# Patient Record
Sex: Male | Born: 1955 | Race: White | Hispanic: No | Marital: Single | Smoking: Former smoker
Health system: Southern US, Community
[De-identification: ages and names within clinical notes are randomized; demographics above are authoritative.]

## PROBLEM LIST (undated history)

## (undated) DIAGNOSIS — C801 Malignant (primary) neoplasm, unspecified: Secondary | ICD-10-CM

## (undated) DIAGNOSIS — M1990 Unspecified osteoarthritis, unspecified site: Secondary | ICD-10-CM

## (undated) DIAGNOSIS — I10 Essential (primary) hypertension: Secondary | ICD-10-CM

## (undated) DIAGNOSIS — E785 Hyperlipidemia, unspecified: Secondary | ICD-10-CM

## (undated) DIAGNOSIS — E042 Nontoxic multinodular goiter: Secondary | ICD-10-CM

## (undated) DIAGNOSIS — K5790 Diverticulosis of intestine, part unspecified, without perforation or abscess without bleeding: Secondary | ICD-10-CM

## (undated) DIAGNOSIS — F329 Major depressive disorder, single episode, unspecified: Secondary | ICD-10-CM

## (undated) DIAGNOSIS — T7840XA Allergy, unspecified, initial encounter: Secondary | ICD-10-CM

## (undated) DIAGNOSIS — F32A Depression, unspecified: Secondary | ICD-10-CM

## (undated) DIAGNOSIS — K219 Gastro-esophageal reflux disease without esophagitis: Secondary | ICD-10-CM

## (undated) HISTORY — DX: Depression, unspecified: F32.A

## (undated) HISTORY — DX: Malignant (primary) neoplasm, unspecified: C80.1

## (undated) HISTORY — DX: Hyperlipidemia, unspecified: E78.5

## (undated) HISTORY — PX: SALIVARY GLAND SURGERY: SHX768

## (undated) HISTORY — DX: Unspecified osteoarthritis, unspecified site: M19.90

## (undated) HISTORY — DX: Essential (primary) hypertension: I10

## (undated) HISTORY — DX: Nontoxic multinodular goiter: E04.2

## (undated) HISTORY — DX: Diverticulosis of intestine, part unspecified, without perforation or abscess without bleeding: K57.90

## (undated) HISTORY — DX: Major depressive disorder, single episode, unspecified: F32.9

## (undated) HISTORY — DX: Allergy, unspecified, initial encounter: T78.40XA

## (undated) HISTORY — DX: Gastro-esophageal reflux disease without esophagitis: K21.9

---

## 2004-05-21 ENCOUNTER — Ambulatory Visit: Payer: Self-pay | Admitting: Internal Medicine

## 2004-07-07 ENCOUNTER — Ambulatory Visit: Payer: Self-pay | Admitting: Internal Medicine

## 2004-11-14 ENCOUNTER — Ambulatory Visit: Payer: Self-pay | Admitting: Internal Medicine

## 2005-02-04 ENCOUNTER — Ambulatory Visit: Payer: Self-pay | Admitting: Internal Medicine

## 2005-03-03 ENCOUNTER — Ambulatory Visit: Payer: Self-pay | Admitting: Internal Medicine

## 2005-03-06 ENCOUNTER — Ambulatory Visit: Payer: Self-pay | Admitting: Internal Medicine

## 2005-06-30 ENCOUNTER — Ambulatory Visit: Payer: Self-pay | Admitting: Internal Medicine

## 2005-07-07 ENCOUNTER — Ambulatory Visit (HOSPITAL_COMMUNITY): Admission: RE | Admit: 2005-07-07 | Discharge: 2005-07-07 | Payer: Self-pay | Admitting: Internal Medicine

## 2005-07-10 ENCOUNTER — Ambulatory Visit: Payer: Self-pay | Admitting: Endocrinology

## 2005-11-03 ENCOUNTER — Ambulatory Visit: Payer: Self-pay | Admitting: Internal Medicine

## 2006-03-12 ENCOUNTER — Ambulatory Visit: Payer: Self-pay | Admitting: Internal Medicine

## 2006-05-31 ENCOUNTER — Ambulatory Visit: Payer: Self-pay | Admitting: Endocrinology

## 2006-06-25 ENCOUNTER — Ambulatory Visit: Payer: Self-pay | Admitting: Internal Medicine

## 2006-06-25 ENCOUNTER — Ambulatory Visit: Payer: Self-pay | Admitting: Endocrinology

## 2006-06-25 LAB — CONVERTED CEMR LAB
BUN: 9 mg/dL (ref 6–23)
CO2: 32 meq/L (ref 19–32)
Calcium: 9.1 mg/dL (ref 8.4–10.5)
Creatinine, Ser: 1.1 mg/dL (ref 0.4–1.5)
Glomerular Filtration Rate, Af Am: 90 mL/min/{1.73_m2}
Glucose, Bld: 107 mg/dL — ABNORMAL HIGH (ref 70–99)
Hgb A1c MFr Bld: 5.2 % (ref 4.6–6.0)
Potassium: 4.4 meq/L (ref 3.5–5.1)
TSH: 0.65 microintl units/mL (ref 0.35–5.50)

## 2006-07-05 ENCOUNTER — Encounter: Admission: RE | Admit: 2006-07-05 | Discharge: 2006-07-05 | Payer: Self-pay | Admitting: Nurse Practitioner

## 2007-01-04 ENCOUNTER — Ambulatory Visit: Payer: Self-pay | Admitting: Internal Medicine

## 2007-01-24 DIAGNOSIS — J309 Allergic rhinitis, unspecified: Secondary | ICD-10-CM

## 2007-03-16 IMAGING — US US SOFT TISSUE HEAD/NECK
1 series · 14 of 25 positions shown · non-contrast
Comparison: none

CLINICAL DATA: Thyroid nodule

[Series 1: unknown · 0.12mm/px · 14 of 39 slices shown]
[im 1/39]
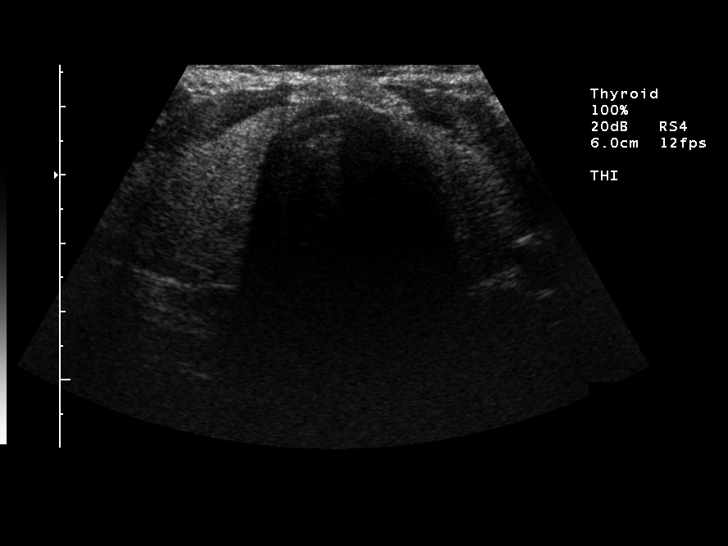
[im 4/39]
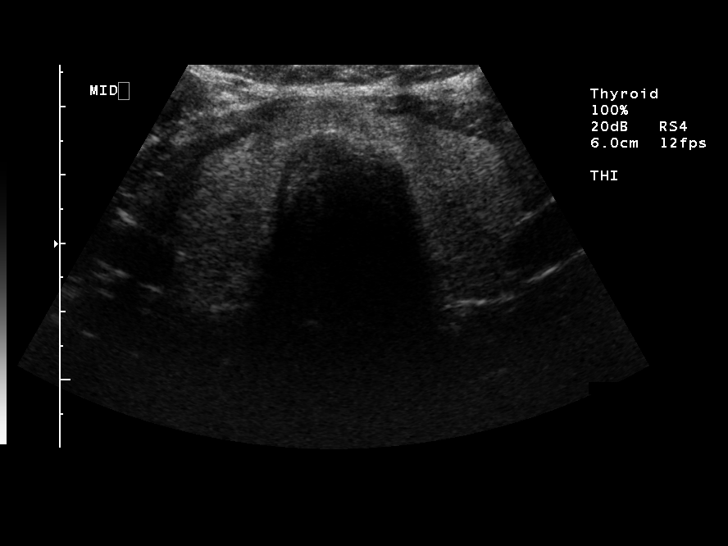
[im 7/39]
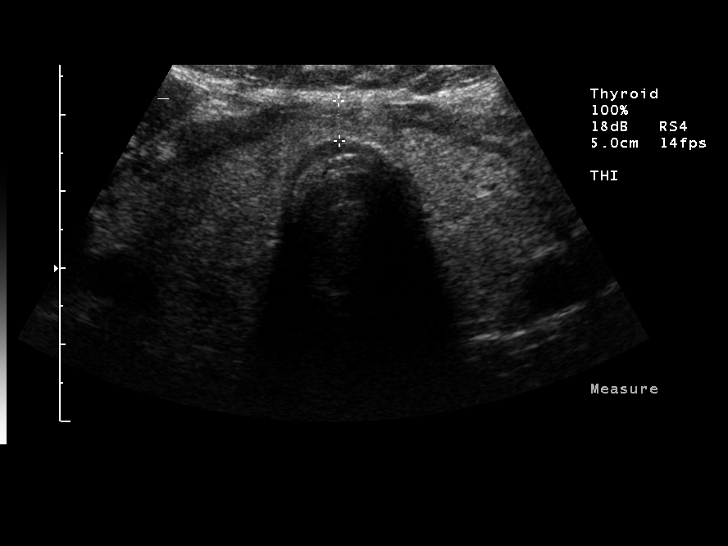
[im 10/39]
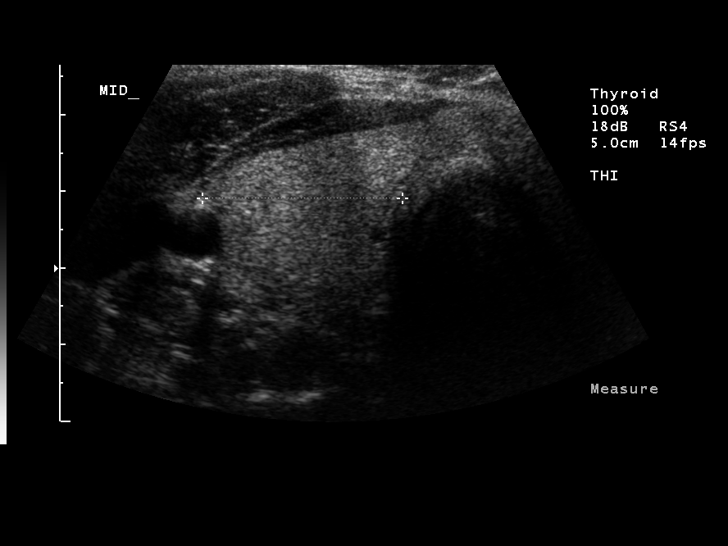
[im 13/39]
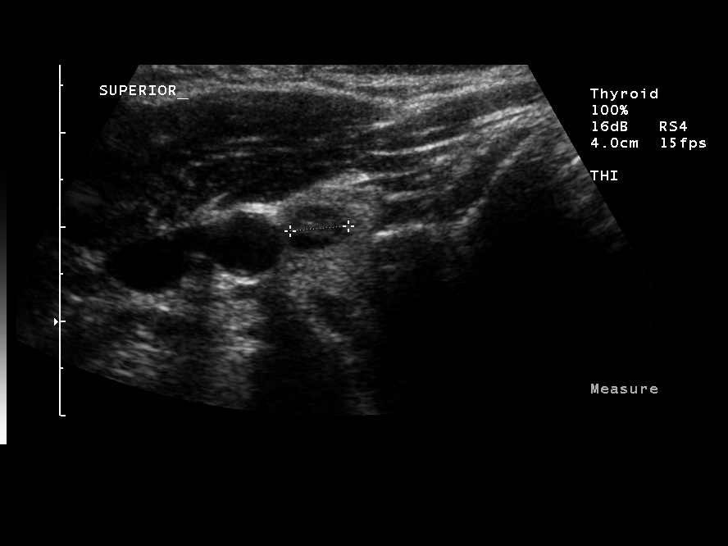
[im 15/39]
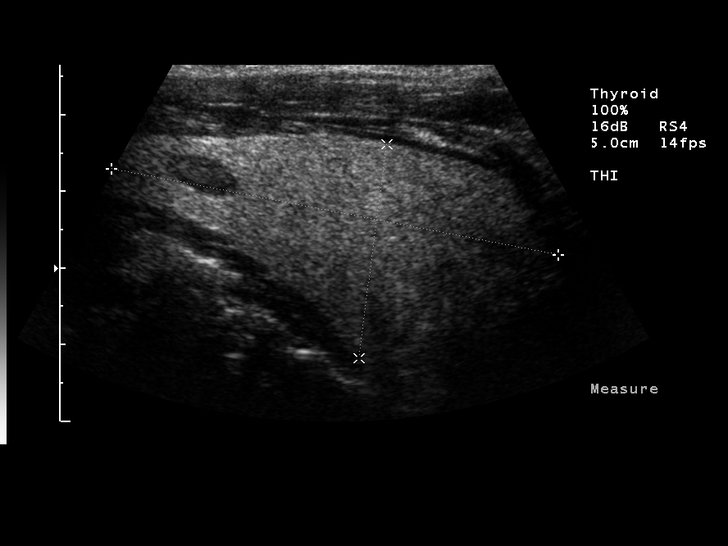
[im 18/39]
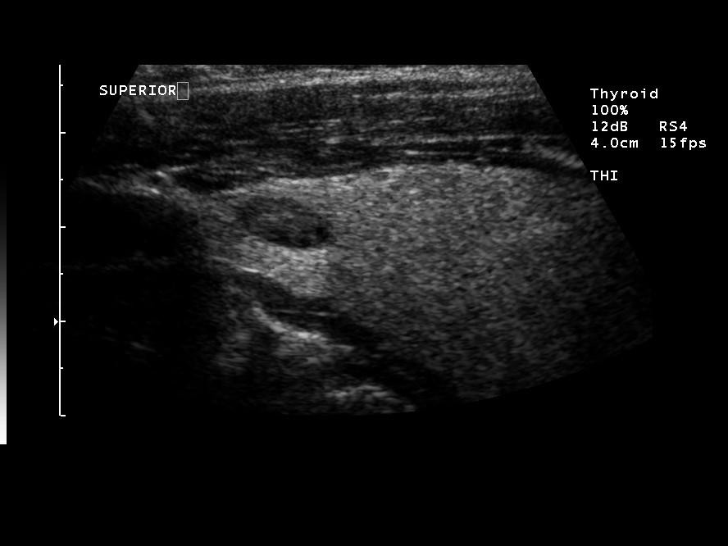
[im 21/39]
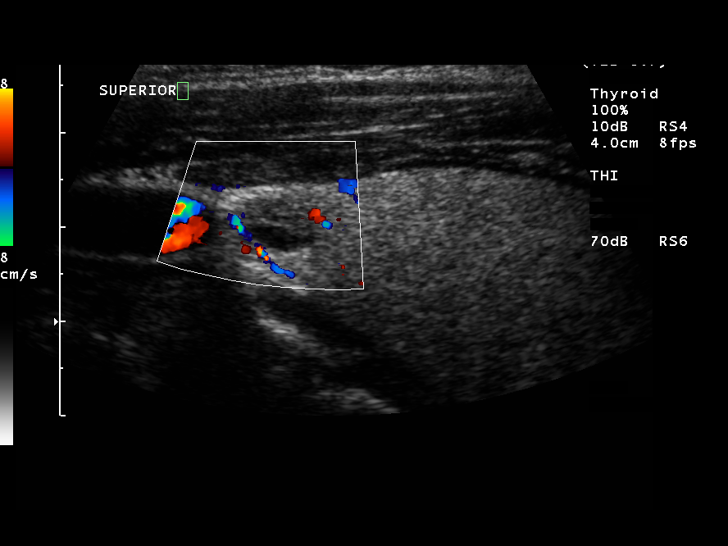
[im 24/39]
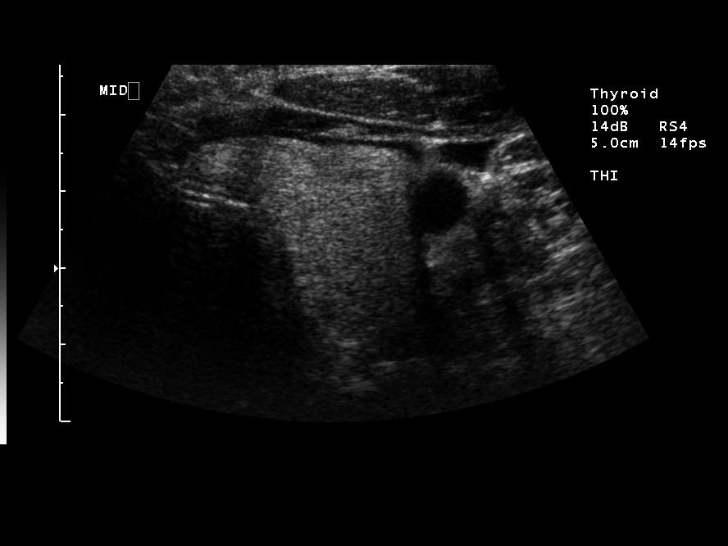
[im 26/39]
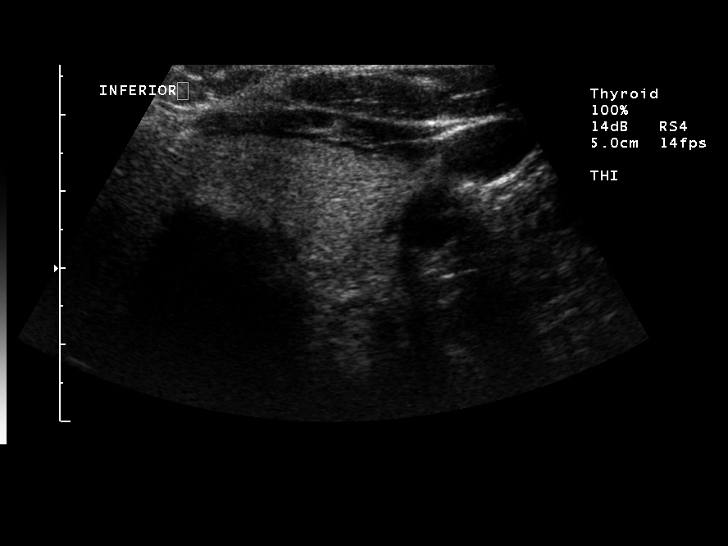
[im 29/39]
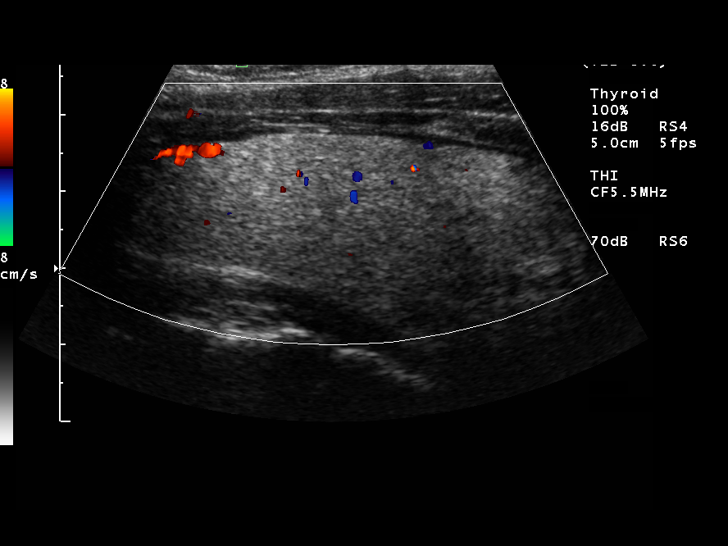
[im 32/39]
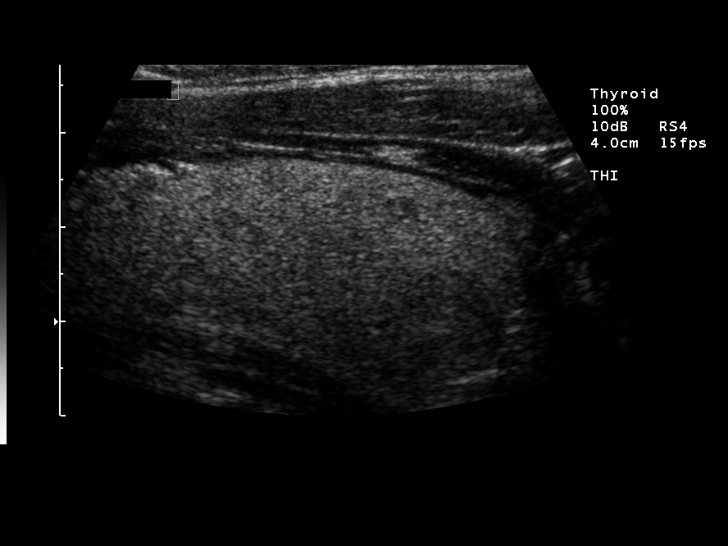
[im 35/39]
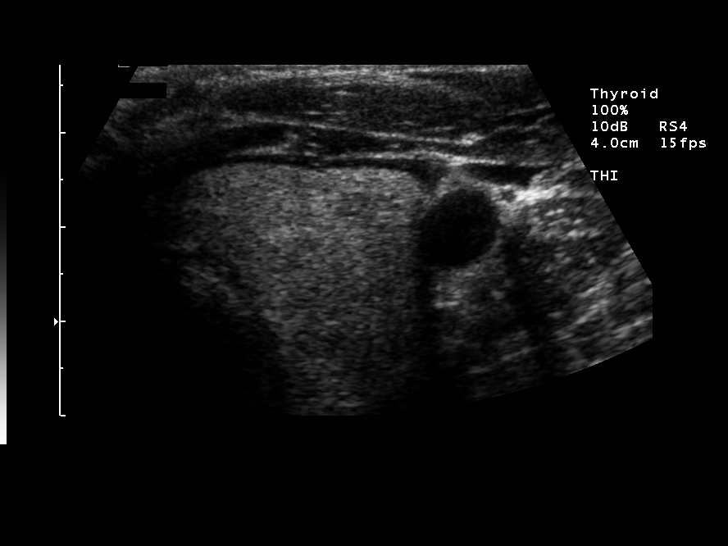
[im 39/39]
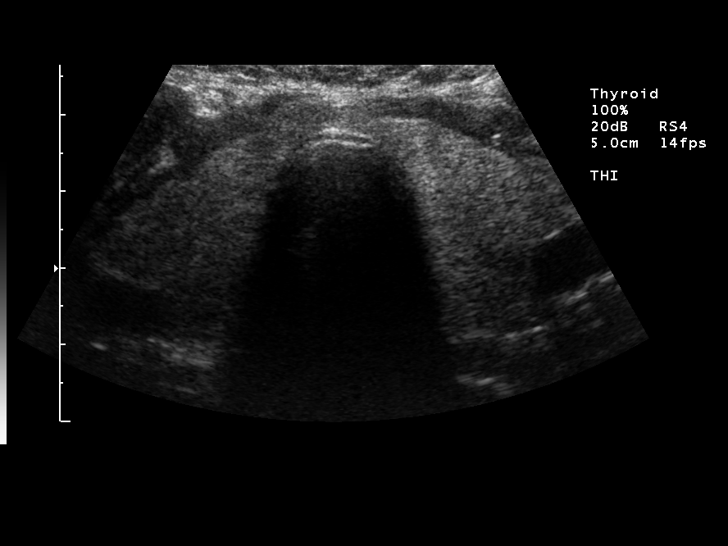

[14 of 25 positions shown; findings below may reference images not displayed]

Ultrasound thyroid:

No previous available for comparison. The right lobe measures 26 x 20 x 59 mm.
There is a 5 x 6 x 11 mm hypoechoic nodule in the superior pole. Isthmus 5 mm in
thickness, unremarkable. Left lobe measures 21 x 24 x 62 mm, containing a 2 x 3
x 3 mm nearly isoechoic nodule in its inferior pole. Remainder of background
thyroid parenchyma is relatively homogeneous in echotexture.
IMPRESSION: 1. A single 11 mm right thyroid nodule. Recommend continued surveillance or FNA.
2. Tiny 3 mm left thyroid nodule

## 2007-06-03 ENCOUNTER — Ambulatory Visit: Payer: Self-pay | Admitting: Internal Medicine

## 2007-06-03 DIAGNOSIS — E739 Lactose intolerance, unspecified: Secondary | ICD-10-CM

## 2007-06-03 DIAGNOSIS — F329 Major depressive disorder, single episode, unspecified: Secondary | ICD-10-CM

## 2007-06-03 DIAGNOSIS — E785 Hyperlipidemia, unspecified: Secondary | ICD-10-CM

## 2007-06-03 DIAGNOSIS — K219 Gastro-esophageal reflux disease without esophagitis: Secondary | ICD-10-CM | POA: Insufficient documentation

## 2007-06-03 DIAGNOSIS — F32A Depression, unspecified: Secondary | ICD-10-CM | POA: Insufficient documentation

## 2008-01-06 ENCOUNTER — Ambulatory Visit: Payer: Self-pay | Admitting: Internal Medicine

## 2008-01-06 DIAGNOSIS — M545 Low back pain: Secondary | ICD-10-CM

## 2008-01-06 DIAGNOSIS — J019 Acute sinusitis, unspecified: Secondary | ICD-10-CM | POA: Insufficient documentation

## 2008-03-13 IMAGING — US US SOFT TISSUE HEAD/NECK
1 series · 13 of 25 positions shown · non-contrast
Comparison: 07/07/05.

CLINICAL DATA: Follow-up goiter with nodules.  
THYROID ULTRASOUND:
TECHNIQUE: Ultrasound examination of the thyroid gland and adjacent soft tissue structures was performed.

[Series 1: us soft tissue head/neck · 0.09mm/px · 13 of 60 slices shown]
[im 1/60]
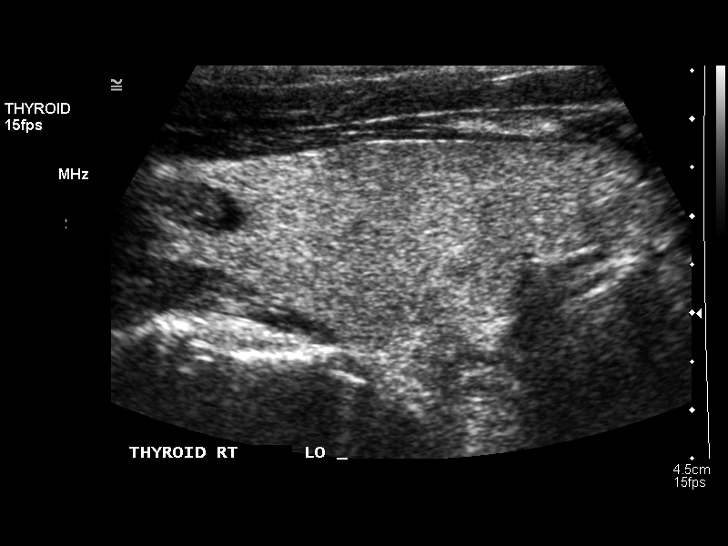
[im 5/60]
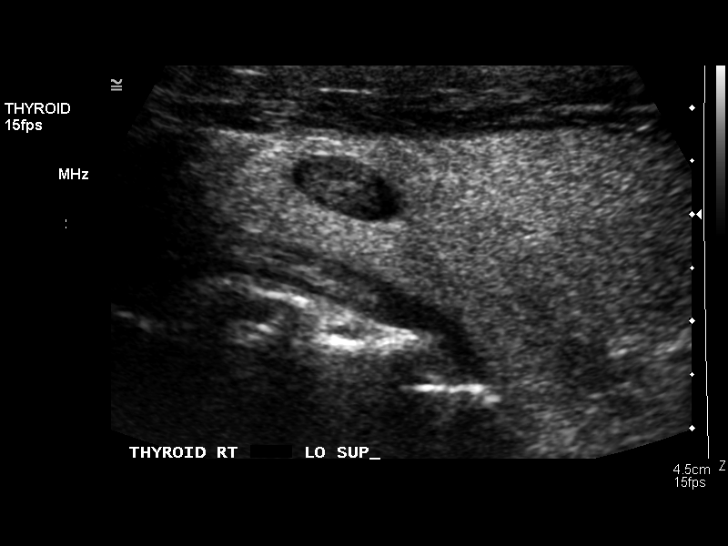
[im 10/60]
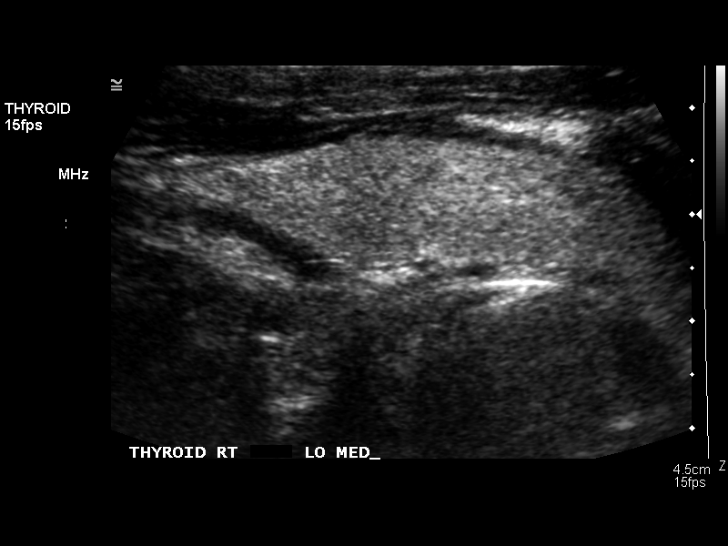
[im 15/60]
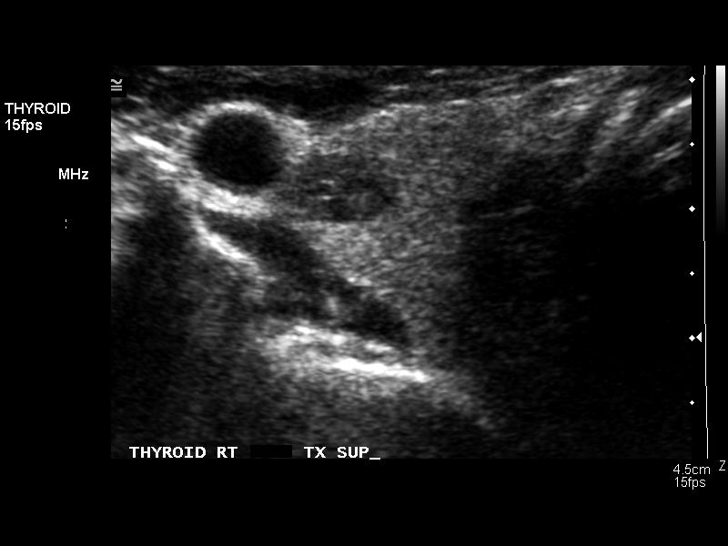
[im 20/60]
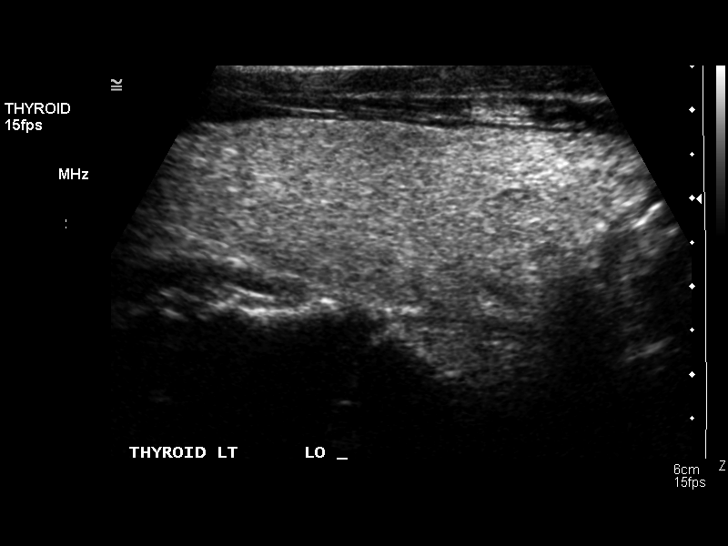
[im 25/60]
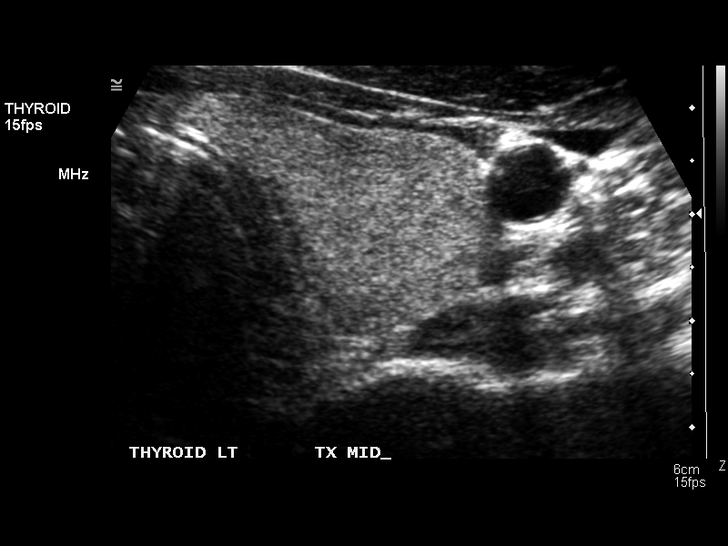
[im 30/60]
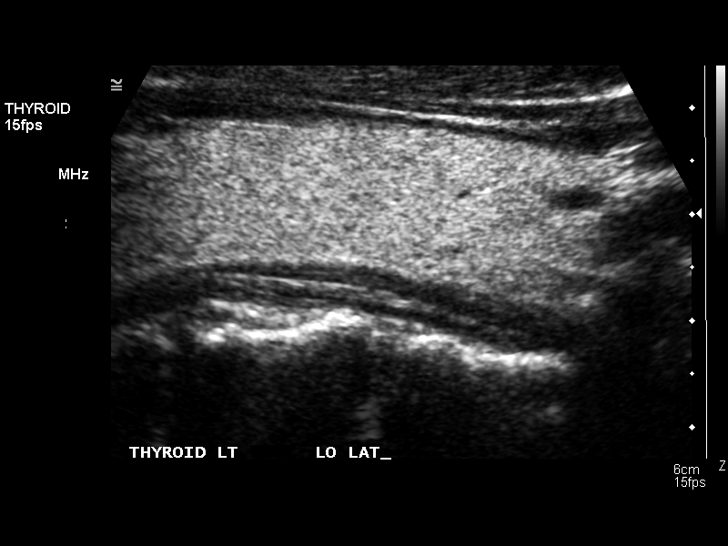
[im 35/60]
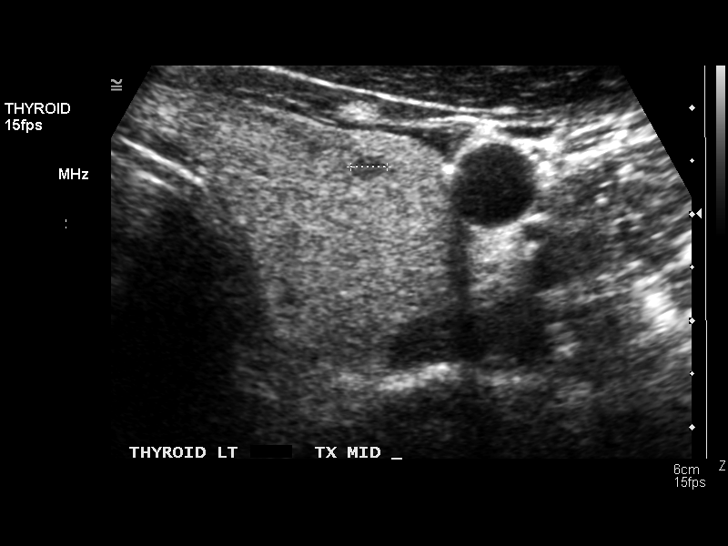
[im 40/60]
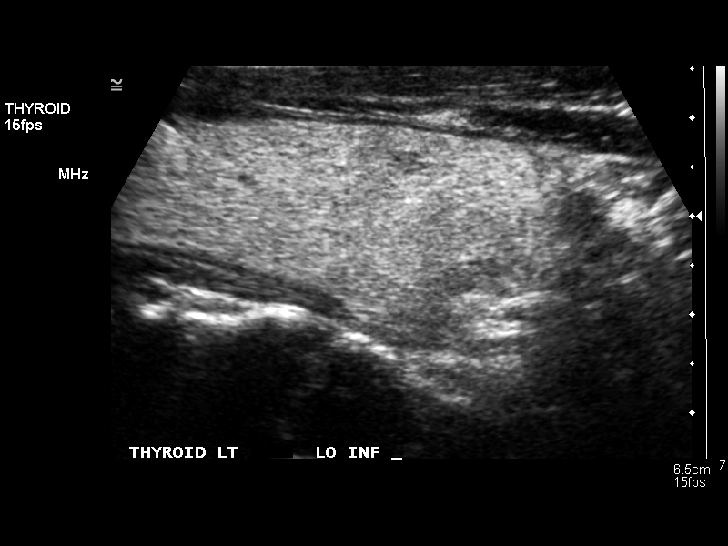
[im 45/60]
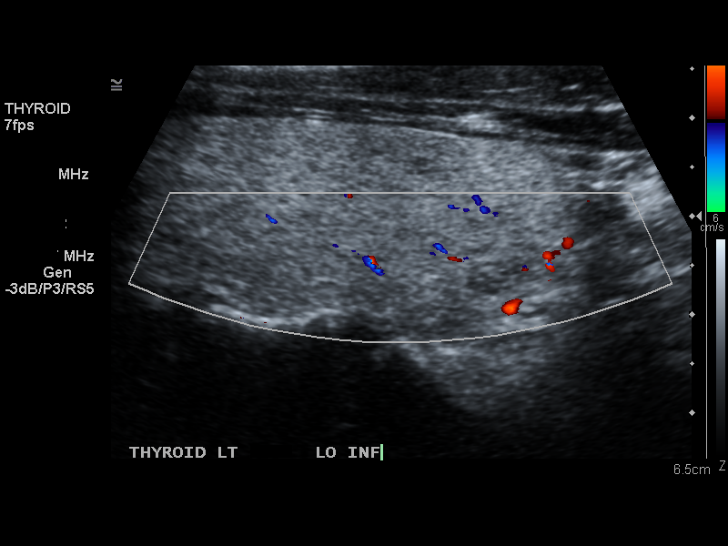
[im 50/60]
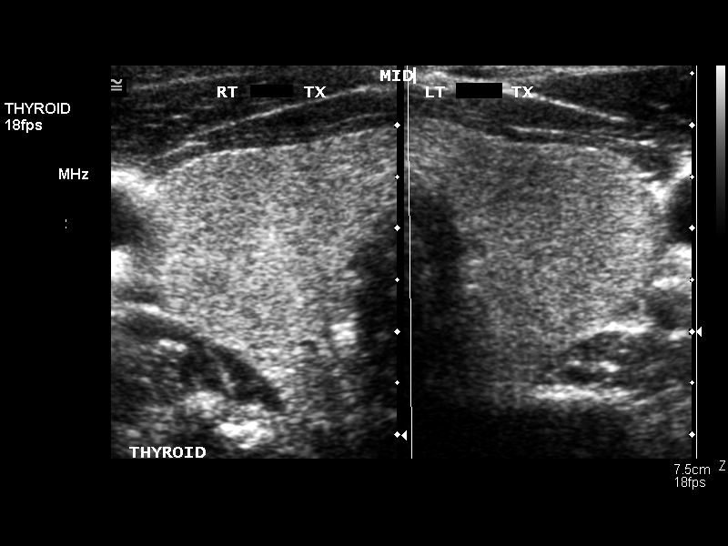
[im 55/60]
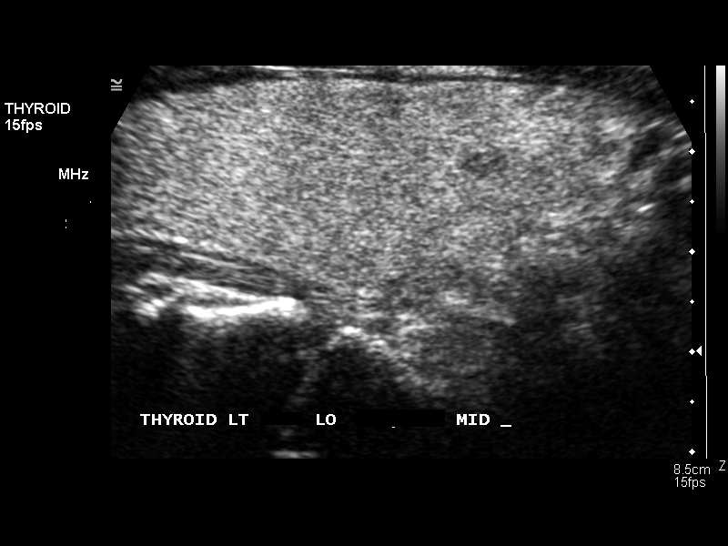
[im 60/60]
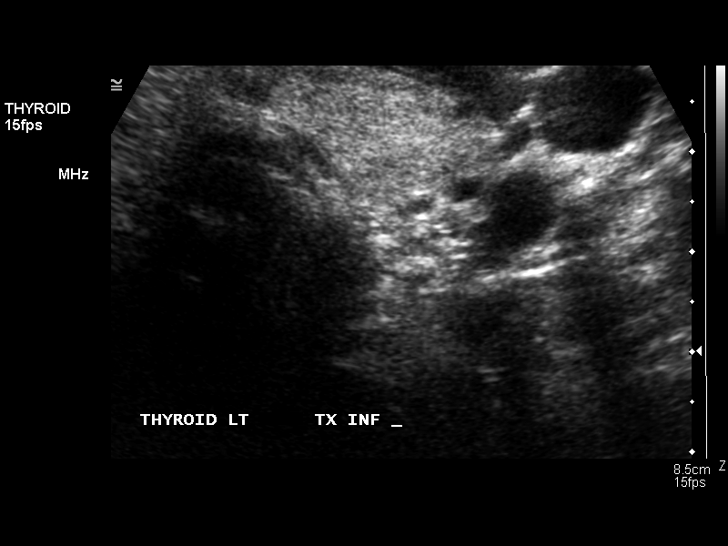

[13 of 25 positions shown; findings below may reference images not displayed]

FINDINGS: The right lobe currently measures 5.6 x 2.2 x 2.2 cm.  The left is currently 6.2 x 2.5 x 2.1 cm.  The size of the gland has not significantly changed.  As noted previously, there is an ovoid shaped nodule in the superior aspect of the right lobe.  It measures 11 mm in long axis by 6  by 8 mm.    This is not significantly changed.  On today's exam, there are several small lesions in the middle and lower aspects of the left lobe.  These are all small ovoid lesions that have a similar appearance to the lesion in the right lobe.  The largest is 7 mm in the posterior aspect of the inferior left lobe.  I cannot say that this lesion was present previously.  A 5 mm nodule in the mid left lobe appears to have increased from about 4 to about 5 mm.  A third small lesion on the left measuring 4 mm may be a new finding.
The most likely diagnosis is multinodular goiter.  None of the nodules is dominant.  I would recommend continued surveillance since there appear to be two new nodules on the left.
IMPRESSION: 1.  Stable gland enlargement.
2.  Stable lesion in the superior aspect of the right lobe.
3.  There now appear to be three small solid lesions in the left lobe, two of which appear new.  
4.  Recommend one year follow-up in light of the new findings on the left.

## 2008-06-20 ENCOUNTER — Ambulatory Visit: Payer: Self-pay | Admitting: Internal Medicine

## 2008-09-18 ENCOUNTER — Ambulatory Visit: Payer: Self-pay | Admitting: Internal Medicine

## 2008-09-18 DIAGNOSIS — R42 Dizziness and giddiness: Secondary | ICD-10-CM

## 2008-10-22 ENCOUNTER — Telehealth: Payer: Self-pay | Admitting: Internal Medicine

## 2008-10-24 ENCOUNTER — Emergency Department (HOSPITAL_COMMUNITY): Admission: EM | Admit: 2008-10-24 | Discharge: 2008-10-24 | Payer: Self-pay | Admitting: Emergency Medicine

## 2008-11-16 ENCOUNTER — Ambulatory Visit: Payer: Self-pay | Admitting: Internal Medicine

## 2008-11-16 LAB — CONVERTED CEMR LAB
Basophils Absolute: 0 10*3/uL (ref 0.0–0.1)
Bilirubin Urine: NEGATIVE
Calcium: 9.3 mg/dL (ref 8.4–10.5)
Creatinine, Ser: 1 mg/dL (ref 0.4–1.5)
GFR calc non Af Amer: 82.46 mL/min (ref >60)
Glucose, Bld: 97 mg/dL (ref 70–99)
HCT: 46.8 % (ref 39.0–52.0)
HDL: 32.3 mg/dL — ABNORMAL LOW (ref >39.00)
Hemoglobin, Urine: NEGATIVE
Hemoglobin: 16.1 g/dL (ref 13.0–17.0)
Lymphs Abs: 2.2 10*3/uL (ref 0.7–4.0)
MCHC: 34.5 g/dL (ref 30.0–36.0)
Monocytes Absolute: 0.4 10*3/uL (ref 0.1–1.0)
Neutrophils Relative %: 56.7 % (ref 43.0–77.0)
Nitrite: NEGATIVE
PSA: 1.03 ng/mL (ref 0.10–4.00)
Potassium: 3.8 meq/L (ref 3.5–5.1)
RBC: 5.1 M/uL (ref 4.22–5.81)
RDW: 12.1 % (ref 11.5–14.6)
Sodium: 144 meq/L (ref 135–145)
Specific Gravity, Urine: 1.025 (ref 1.000–1.030)
Total Bilirubin: 1.1 mg/dL (ref 0.3–1.2)
Urine Glucose: NEGATIVE mg/dL
Urobilinogen, UA: 0.2 (ref 0.0–1.0)
pH: 5.5 (ref 5.0–8.0)

## 2008-11-23 ENCOUNTER — Ambulatory Visit: Payer: Self-pay | Admitting: Internal Medicine

## 2008-11-23 DIAGNOSIS — F419 Anxiety disorder, unspecified: Secondary | ICD-10-CM | POA: Insufficient documentation

## 2008-11-23 DIAGNOSIS — F411 Generalized anxiety disorder: Secondary | ICD-10-CM

## 2009-10-01 ENCOUNTER — Ambulatory Visit: Payer: Self-pay | Admitting: Internal Medicine

## 2009-10-01 DIAGNOSIS — R635 Abnormal weight gain: Secondary | ICD-10-CM

## 2009-10-01 DIAGNOSIS — K602 Anal fissure, unspecified: Secondary | ICD-10-CM | POA: Insufficient documentation

## 2009-10-01 DIAGNOSIS — K921 Melena: Secondary | ICD-10-CM

## 2009-10-03 ENCOUNTER — Telehealth: Payer: Self-pay | Admitting: Internal Medicine

## 2009-11-18 ENCOUNTER — Telehealth: Payer: Self-pay | Admitting: Internal Medicine

## 2009-11-18 ENCOUNTER — Ambulatory Visit: Payer: Self-pay | Admitting: Internal Medicine

## 2010-02-19 ENCOUNTER — Telehealth: Payer: Self-pay | Admitting: Internal Medicine

## 2010-05-15 ENCOUNTER — Ambulatory Visit: Payer: Self-pay | Admitting: Internal Medicine

## 2010-05-15 DIAGNOSIS — J069 Acute upper respiratory infection, unspecified: Secondary | ICD-10-CM | POA: Insufficient documentation

## 2010-07-03 IMAGING — CR DG CHEST 2V
2 series · 2 of 2 positions shown · non-contrast
Comparison: None

CLINICAL DATA: Weakness.

CHEST - 2 VIEW

[w chest pa]
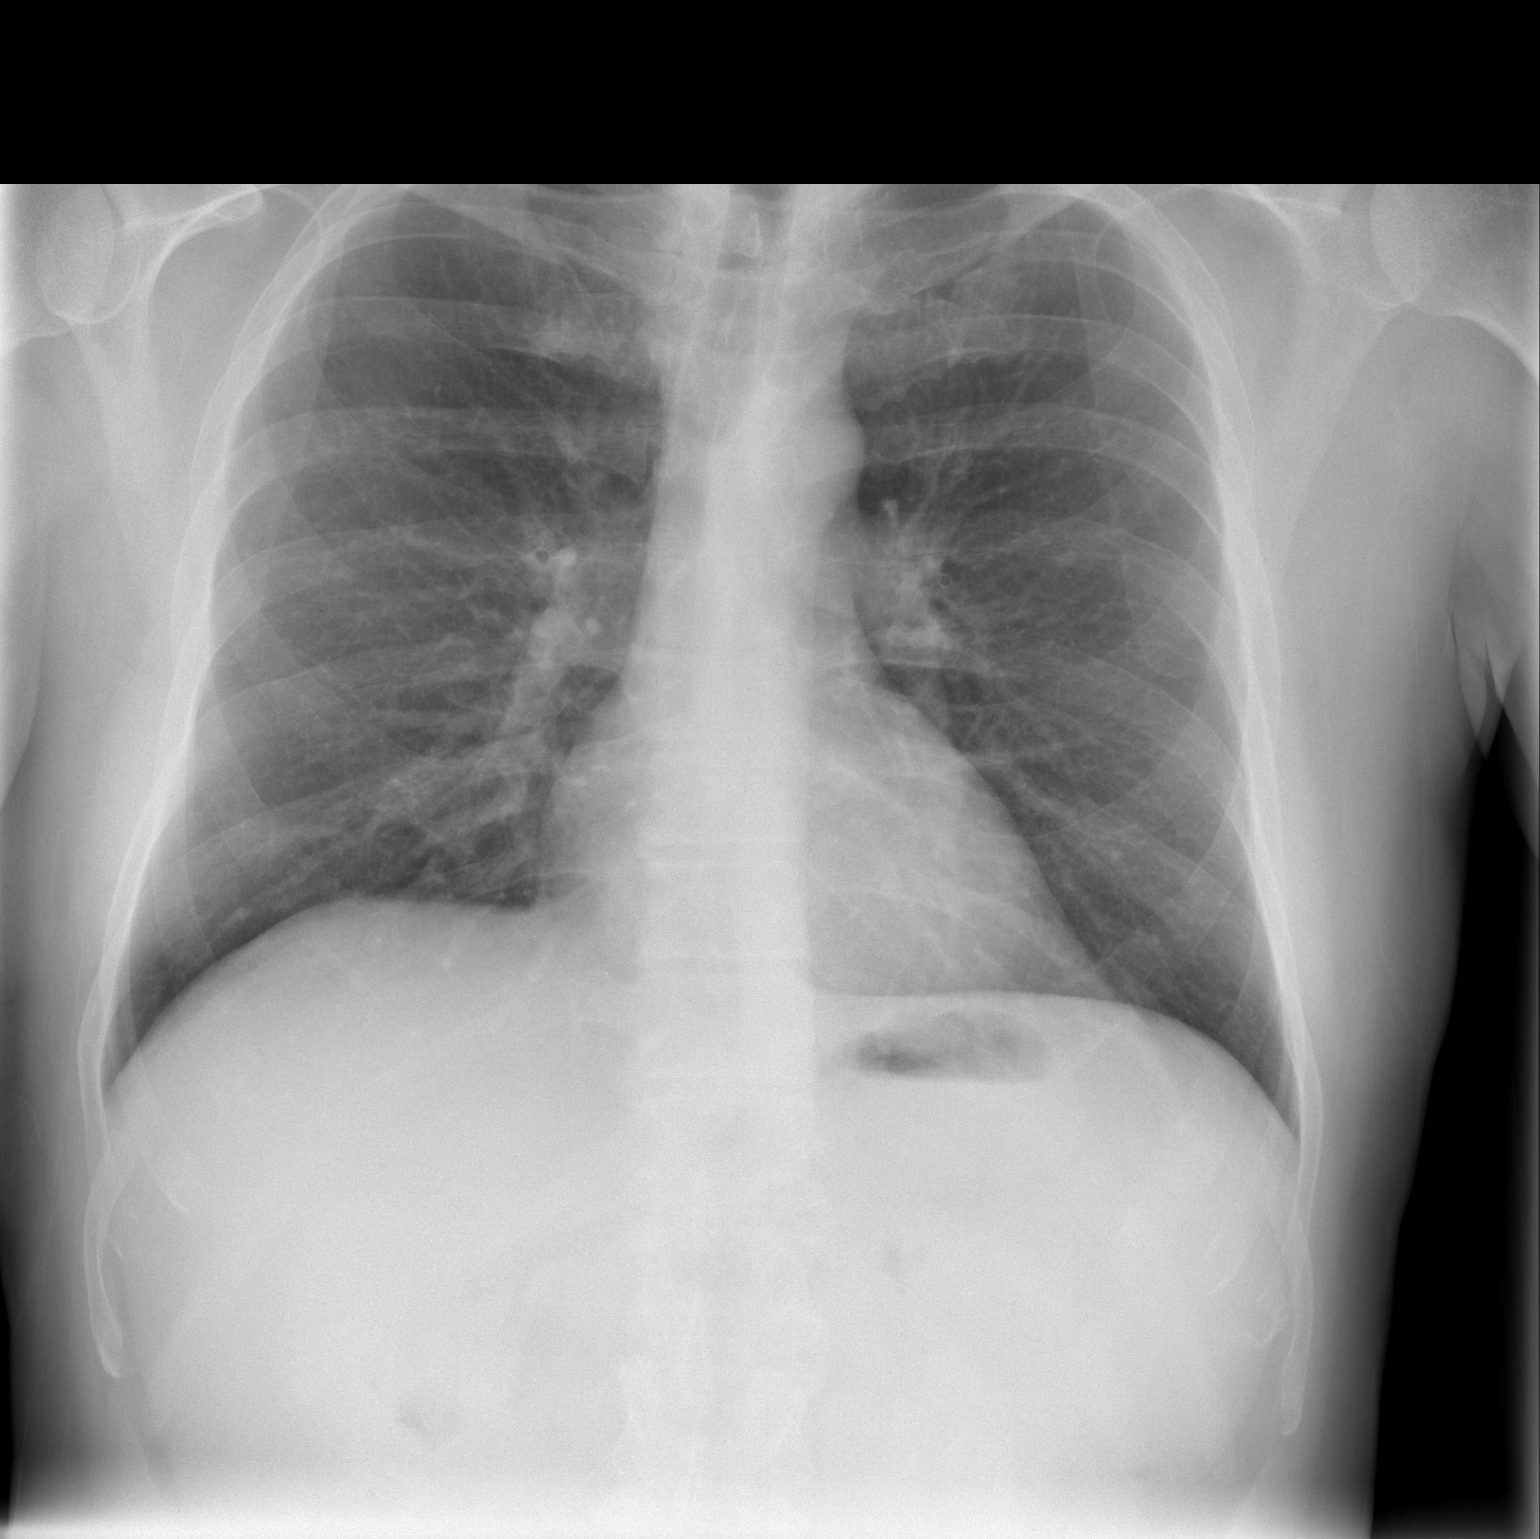

[w chest lat]
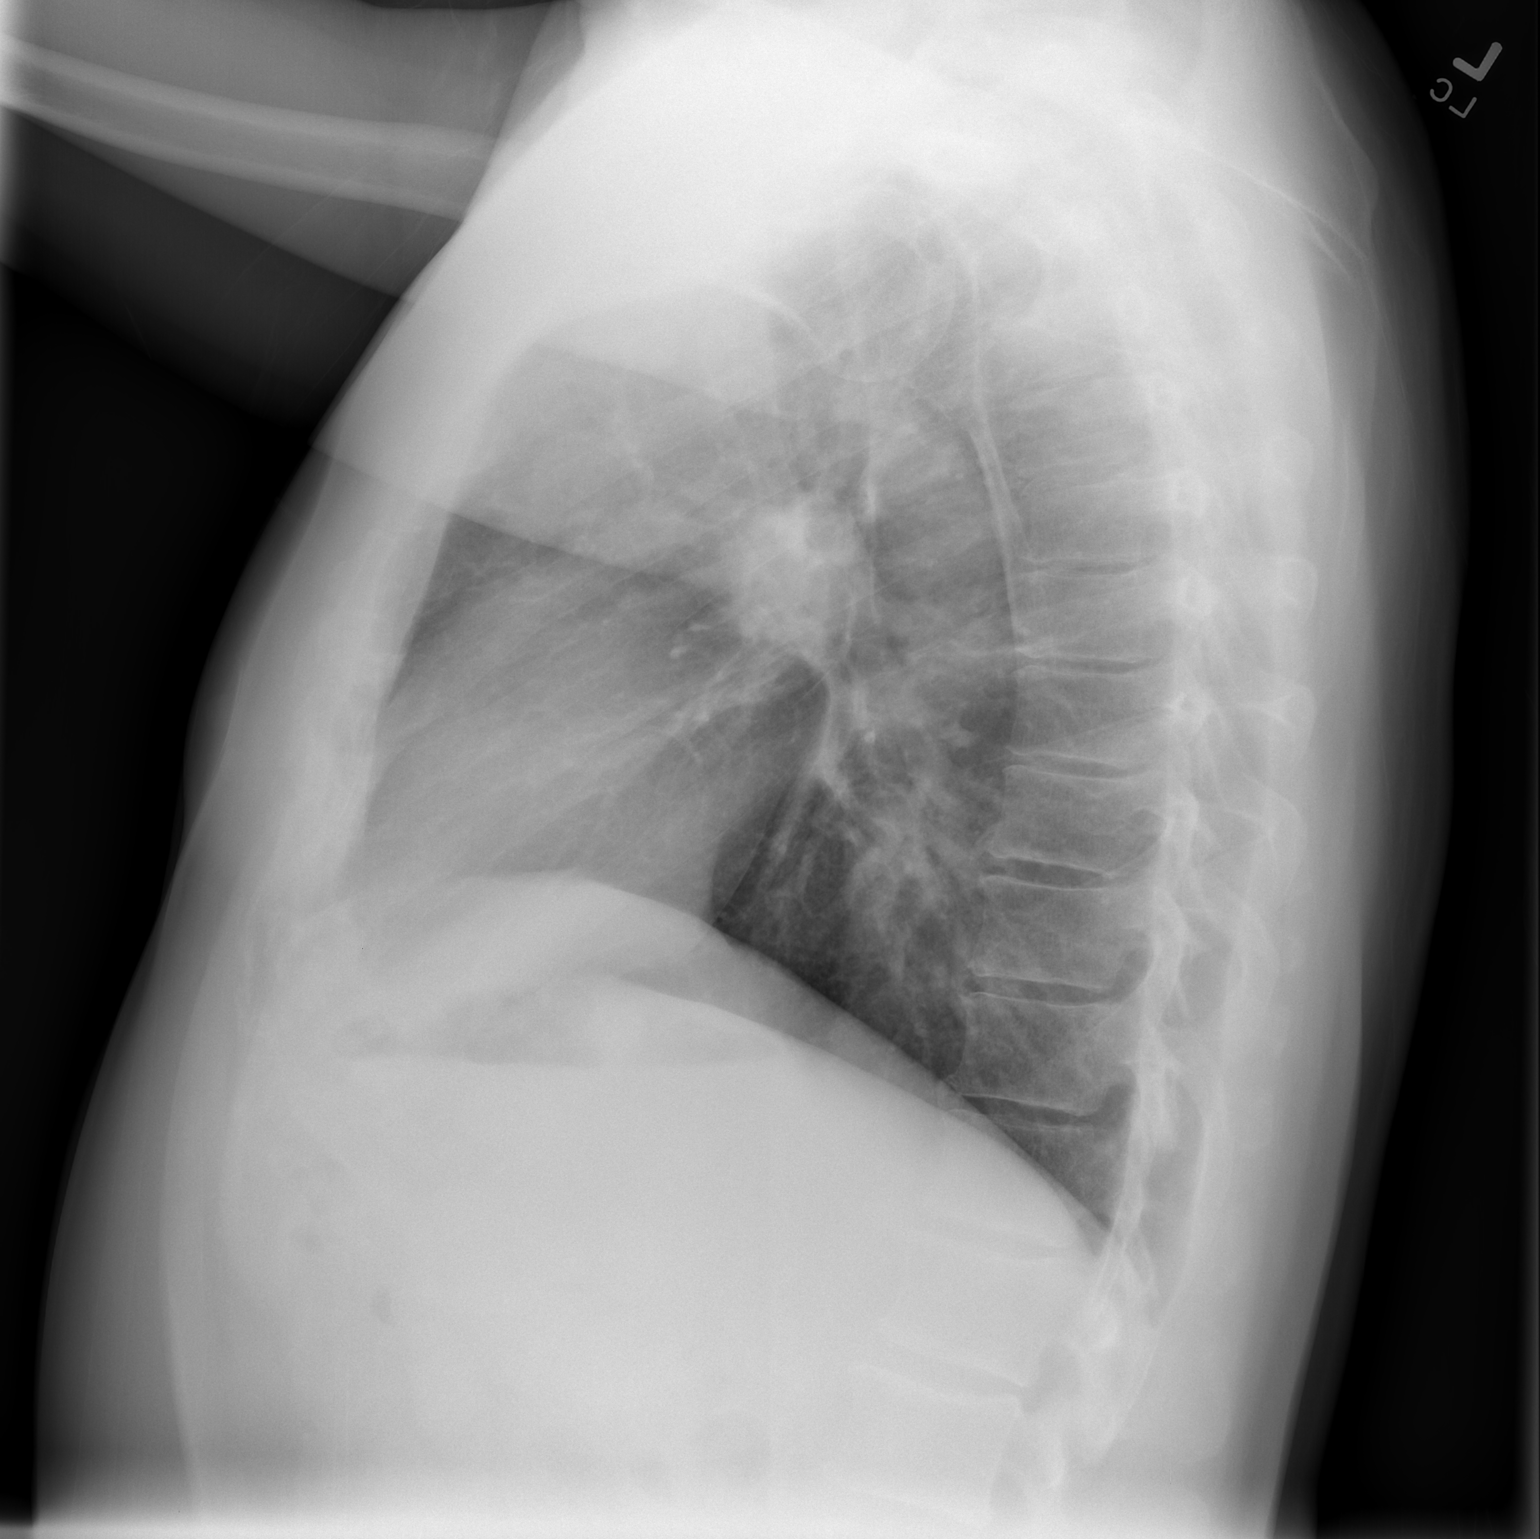

[2 of 2 positions shown; findings below may reference images not displayed]

FINDINGS: Heart and mediastinal contours are within normal limits.
No focal opacities or effusions.  No acute bony abnormality.
IMPRESSION: No active disease.

## 2010-07-04 ENCOUNTER — Ambulatory Visit: Admit: 2010-07-04 | Payer: Self-pay | Admitting: Internal Medicine

## 2010-07-11 ENCOUNTER — Ambulatory Visit: Admit: 2010-07-11 | Payer: Self-pay | Admitting: Internal Medicine

## 2010-07-11 ENCOUNTER — Ambulatory Visit
Admission: RE | Admit: 2010-07-11 | Discharge: 2010-07-11 | Payer: Self-pay | Source: Home / Self Care | Attending: Internal Medicine | Admitting: Internal Medicine

## 2010-07-20 ENCOUNTER — Encounter: Payer: Self-pay | Admitting: Internal Medicine

## 2010-07-27 LAB — CONVERTED CEMR LAB: Blood Glucose, Fingerstick: 145

## 2010-07-31 NOTE — Assessment & Plan Note (Signed)
Summary: TDAP / SD  Nurse Visit   Allergies: 1)  ! * Avelox  Immunizations Administered:  Tetanus Vaccine:    Vaccine Type: Tdap    Site: left deltoid    Mfr: GlaxoSmithKline    Dose: 0.5 ml    Route: IM    Given by: Ami Bullins CMA    Exp. Date: 09/21/2011    Lot #: [id number]    VIS given: 05/17/07 version given Nov 18, 2009.  Orders Added: 1)  Tdap => 27yrs IM [90715] 2)  Admin 1st Vaccine [09811]

## 2010-07-31 NOTE — Progress Notes (Signed)
  Phone Note Call from Patient   Summary of Call: Pt cut his finger on lawnmower, needs tetanus. Scheduled for today Initial call taken by: Lamar Sprinkles, CMA,  Nov 18, 2009 8:43 AM

## 2010-07-31 NOTE — Assessment & Plan Note (Signed)
Summary: SORE THROAT/ SINUS/NWS  #   Vital Signs:  Patient profile:   55 year old male Height:      70 inches Weight:      211 pounds BMI:     30.38 Temp:     99.1 degrees F oral Pulse rate:   80 / minute Pulse rhythm:   regular Resp:     16 per minute BP sitting:   126 / 90  (left arm) Cuff size:   regular  Vitals Entered By: Lanier Prude, Beverly Gust) (May 15, 2010 10:56 AM) CC: sinus congestion/drainage and sore throat X 2-3 days Is Patient Diabetic? No Comments pt is not currently taking any meds.    CC:  sinus congestion/drainage and sore throat X 2-3 days.  History of Present Illness: The patient presents with complaints of sore throat, fever, cough, sinus congestion and drainge of 2 days duration. Not better with OTC meds. Muscle aches are present.  The mucus is colored.   Current Medications (verified): 1)  Vitamin D 1000 Unit Tabs (Cholecalciferol) .Marland Kitchen.. 1 By Mouth Qd 2)  Dexilant 60 Mg Cpdr (Dexlansoprazole) .Marland Kitchen.. 1 By Mouth Q Am For Indigestion  Allergies (verified): 1)  ! 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   Neck:  L submand salivary gland is enlarged and L anter LN below the saliv gland - NT Lungs:  Normal respiratory effort, chest expands symmetrically. Lungs are clear to auscultation, no crackles or wheezes. Heart:  Normal rate and regular rhythm. S1 and S2 normal without gallop, murmur, click, rub or other extra sounds.   Impression & Recommendations:  Problem # 1:  ALLERGIC RHINITIS (ICD-477.9) Assessment  New  His updated medication list for this problem includes:    Loratadine 10 Mg Tabs (Loratadine) .Marland Kitchen... 1 by mouth once daily as needed allergies  Problem # 2:  UPPER RESPIRATORY INFECTION, ACUTE (ICD-465.9) Assessment: New Zpac if worse His updated medication list for this problem includes:    Loratadine 10 Mg Tabs (Loratadine) .Marland Kitchen... 1 by mouth once daily as needed allergies  Complete Medication List: 1)  Vitamin D 1000 Unit Tabs (Cholecalciferol) .Marland Kitchen.. 1 by mouth qd 2)  Dexilant 60 Mg Cpdr (Dexlansoprazole) .Marland Kitchen.. 1 by mouth q am for indigestion 3)  Loratadine 10 Mg Tabs (Loratadine) .Marland Kitchen.. 1 by mouth once daily as needed allergies 4)  Zithromax Z-pak 250 Mg Tabs (Azithromycin) .... As dirrected  Patient Instructions: 1)  Please schedule a follow-up appointment in 2-3 months well w/labs and with Allergy profile Region II 477.90 . Prescriptions: LORATADINE 10 MG TABS (LORATADINE) 1 by mouth once daily as needed allergies  #30 x 6   Entered and Authorized by:   Tresa Garter MD   Signed by:   Tresa Garter MD on 05/15/2010   Method used:   Print then Give to Patient   RxID:   [id number] ZITHROMAX Z-PAK 250 MG TABS (AZITHROMYCIN) as dirrected  #1 x 0   Entered and Authorized by:   Tresa Garter MD   Signed by:   Tresa Garter MD on 05/15/2010   Method used:   Print then Give to  Patient   RxID:   [id number]    Orders Added: 1)  Est. Patient Level III [14782]

## 2010-07-31 NOTE — Progress Notes (Signed)
Summary: ABX  Phone Note Call from Patient Call back at [phone]   Summary of Call: patient left message on triage in regards to his sore throat asking if he should have rec'd abx. Please advise. Initial call taken by: Lucious Groves,  October 03, 2009 10:17 AM  Follow-up for Phone Call        OK Z pac Follow-up by: Tresa Garter MD,  October 03, 2009 12:11 PM  Additional Follow-up for Phone Call Additional follow up Details #1::        patient notified. Additional Follow-up by: Lucious Groves,  October 03, 2009 1:32 PM    New/Updated Medications: ZITHROMAX Z-PAK 250 MG TABS (AZITHROMYCIN) as dirrected Prescriptions: ZITHROMAX Z-PAK 250 MG TABS (AZITHROMYCIN) as dirrected  #1 x 0   Entered by:   Lucious Groves   Authorized by:   Tresa Garter MD   Signed by:   Lucious Groves on 10/03/2009   Method used:   [facility] to        [facility] [address] [phone]* (retail)       [address]       [place], [place]  [address]       Ph: [phone] or [phone]       Fax: [phone]   RxID:   [id number]

## 2010-07-31 NOTE — Assessment & Plan Note (Signed)
Summary: STOMACH PROBLEM  STC   Vital Signs:  Patient profile:   55 year old male Height:      70 inches Weight:      209 pounds BMI:     30.10 O2 Sat:      97 % on Room air Temp:     98.6 degrees F oral Pulse rate:   93 / minute BP sitting:   150 / 90  (left arm) Cuff size:   regular  Vitals Entered By: Lucious Groves (October 01, 2009 11:02 AM)  O2 Flow:  Room air CC: Pt did a detox for 3 weeks and feels it caused too much acid. ?reflux? Pt epigastric discomfort is relieved by food. Patient also c/o nasal drainage at night./kb Is Patient Diabetic? No Pain Assessment Patient in pain? no      Comments Patient states that he is not taking any of the meds previously on his med list./kb   CC:  Pt did a detox for 3 weeks and feels it caused too much acid. ?reflux? Pt epigastric discomfort is relieved by food. Patient also c/o nasal drainage at night./kb.  History of Present Illness: C/o abd discomfort after he  started to drink lemon juce of 1 lemon w/water in am's: belching , gas, mucus and bad breath He had a hard BM - was very painful and he bled  3 months ago; now is having an occasional anal pain. BMs are nl now.  Current Medications (verified): 1)  None  Allergies (verified): 1)  ! 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  The patient denies chest pain and dyspnea on exertion.    Physical Exam  General:  alert and  overweight-appearing  Ears:  mild wax on R Nose:  nasal dischargemucosal pallor and mucosal erythema.   Mouth:  pharyngeal  erythema and fair dentition.   Neck:  supple and cervical lymphadenopathy.   Lungs:  Normal respiratory effort, chest expands symmetrically. Lungs are clear to auscultation, no crackles or wheezes. Heart:  Normal rate and regular rhythm. S1 and S2 normal without gallop, murmur, click, rub or other extra sounds. Abdomen:  Bowel sounds positive,abdomen soft and non-tender without masses, organomegaly or hernias noted. Rectal:  No external abnormalities noted. Normal sphincter tone. No rectal masses; mild  tenderness. Prostate:  no gland enlargement.   Msk:  No deformity or scoliosis noted of thoracic or lumbar spine.   Extremities:  No clubbing, cyanosis, edema, or deformity noted with normal full range of motion of all joints.   Neurologic:  No cranial nerve deficits noted. Station and gait are normal. Plantar reflexes are down-going bilaterally. DTRs are symmetrical throughout. Sensory, motor and coordinative functions appear intact.  Skin:  Intact without suspicious lesions or rashes Psych:  Cognition and judgment appear intact. Alert and cooperative with normal attention span and concentration. No apparent delusions, illusions, hallucinations   Impression & Recommendations:  Problem # 1:  GERD (ICD-530.81) - gastritis Assessment Deteriorated  His updated medication list for this problem includes:    Dexilant 60 Mg Cpdr (Dexlansoprazole) .Marland Kitchen... 1 by mouth q am for indigestion  Problem # 2:  ELEVATED BLOOD PRESSURE (ICD-796.2) Assessment: New Loose wt   Problem # 3:  WEIGHT GAIN, ABNORMAL (ICD-783.1) Assessment: New Start dieting  Problem # 4:  HEMATOCHEZIA (ICD-578.1) due to #5 Assessment: New  Procedure: Anoscopy Indication: Rectal bleeding Risks and benefits were explained. The pt. was placed in the R decubitus position. Digital rectal exam revealed no masses. Anoscope was introduced w/o difficulties. Upon withdrawl, a carefull look at the mucosa was obtained. At  9 o'clock a      5   mm anal fissure was present without active bleeding. Impression: Anal fissure. No external hemorrhoid. Disposition: see A&P.  Tolerated well. Complications: none.  Orders: Anoscopy (86578)  Problem # 5:  ANAL FISSURE (ICD-565.0) Assessment: New  Anusol HC  Orders: Anoscopy (46962)  Complete Medication List: 1)  Anusol-hc 25 Mg Supp (Hydrocortisone acetate) .Marland Kitchen.. 1 pr two times a day for hemorrhoids 2)  Vitamin D 1000 Unit Tabs (Cholecalciferol) .Marland Kitchen.. 1 by mouth qd 3)  Dexilant 60 Mg Cpdr (Dexlansoprazole) .Marland Kitchen.. 1 by mouth q am for indigestion  Patient Instructions: 1)  Wet wipes 2)  Call if you are not better in a reasonable amount of time or if worse.  3)  Normal BP <135/85 4)  Please schedule a follow-up appointment in 3 months. Prescriptions: ANUSOL-HC 25 MG SUPP (HYDROCORTISONE ACETATE) 1 pr two times a day for hemorrhoids  #20 x 3   Entered and Authorized by:   Tresa Garter MD   Signed by:   Tresa Garter MD on 10/01/2009   Method used:   Print then Give to Patient   RxID:   [id number] DEXILANT 60 MG CPDR (DEXLANSOPRAZOLE) 1 by mouth q am for indigestion  #90 x 3   Entered and Authorized by:   Tresa Garter MD   Signed by:   Tresa Garter MD on 10/01/2009   Method used:   Print then Give to Patient   RxID:   [id number] ANUSOL-HC 25 MG SUPP (HYDROCORTISONE ACETATE) 1 pr two times a day for hemorrhoids  #20 x 3   Entered and Authorized by:   Tresa Garter MD   Signed by:   Tresa Garter MD on 10/01/2009   Method used:   Print then Give to Patient   RxID:   [id number] NEXIUM 40 MG CPDR (ESOMEPRAZOLE MAGNESIUM) 1 by mouth qam  #30 x 12   Entered and Authorized by:   Tresa Garter MD   Signed by:   Tresa Garter MD on 10/01/2009   Method used:   Print then Give to Patient   RxID:   [id number] VITAMIN B-12 500 MCG TABS (CYANOCOBALAMIN) 1 by mouth once daily for Vitamin B12 deficiency  #30 x 12    Entered and Authorized by:   Tresa Garter MD   Signed by:   Tresa Garter MD on 10/01/2009   Method used:   Print then Give to Patient   RxID:   [id number]

## 2010-07-31 NOTE — Progress Notes (Signed)
Summary: refill  Phone Note Refill Request Call back at [phone] Message from:  Patient on February 19, 2010 10:48 AM  Refills Requested: Medication #1:  Z PAk Please advise.  Initial call taken by: Lucious Groves CMA,  February 19, 2010 10:48 AM  Follow-up for Phone Call        ok to fill Follow-up by: Tresa Garter MD,  February 19, 2010 5:55 PM  Additional Follow-up for Phone Call Additional follow up Details #1::        Left message on voicemail notifying patient.  Additional Follow-up by: Lucious Groves CMA,  February 20, 2010 8:54 AM    New/Updated Medications: ZITHROMAX Z-PAK 250 MG TABS (AZITHROMYCIN) as directed Prescriptions: ZITHROMAX Z-PAK 250 MG TABS (AZITHROMYCIN) as directed  #1 x 0   Entered by:   Lucious Groves CMA   Authorized by:   Tresa Garter MD   Signed by:   Lucious Groves CMA on 02/20/2010   Method used:   [facility]        [facility] [address] [phone]* (retail)       [address]       [place], [place]  [address]       Ph: [phone] or [phone]       Fax: [phone]   RxID:   [id number]

## 2010-07-31 NOTE — Assessment & Plan Note (Signed)
Summary: ALLERGY PROBLEM  STC   Vital Signs:  Patient profile:   55 year old male Height:      70 inches Weight:      212 pounds BMI:     30.53 Temp:     98.7 degrees F oral Pulse rate:   96 / minute Pulse rhythm:   regular Resp:     16 per minute BP sitting:   150 / 100  (left arm) Cuff size:   regular  Vitals Entered By: Lanier Prude, CMA(AAMA) (July 11, 2010 10:34 AM) CC: sinus drainage and congestion X 3 days Is Patient Diabetic? No Comments pt states he is not currently taking any meds   CC:  sinus drainage and congestion X 3 days.  History of Present Illness: The patient presents with complaints of sore throat, fever, cough, sinus congestion and drainge of several days duration. Not better with OTC meds. Muscle aches are present.  The mucus is not colored.   Current Medications (verified): 1)  Vitamin D 1000 Unit Tabs (Cholecalciferol) .Marland Kitchen.. 1 By Mouth Qd 2)  Dexilant 60 Mg Cpdr (Dexlansoprazole) .Marland Kitchen.. 1 By Mouth Q Am For Indigestion 3)  Loratadine 10 Mg Tabs (Loratadine) .Marland Kitchen.. 1 By Mouth Once Daily As Needed Allergies  Allergies (verified): 1)  ! 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    Physical Exam  General:  alert and  overweight-appearing  Mouth:  pharyngeal erythema and fair dentition.   Neck:  L submand salivary gland is enlarged and L anter LN below the saliv gland - NT Lungs:  Normal respiratory effort, chest expands symmetrically. Lungs are clear to auscultation, no crackles or wheezes. Heart:  Normal rate and regular rhythm. S1 and S2 normal without gallop, murmur, click, rub or other extra  sounds. Abdomen:  Bowel sounds positive,abdomen soft and non-tender without masses, organomegaly or hernias noted. Msk:  No deformity or scoliosis noted of thoracic or lumbar spine.   Neurologic:  No cranial nerve deficits noted. Station and gait are normal. Plantar reflexes are down-going bilaterally. DTRs are symmetrical throughout. Sensory, motor and coordinative functions appear intact.    Impression & Recommendations:  Problem # 1:  UPPER RESPIRATORY INFECTION, ACUTE (ICD-465.9) Assessment New  His updated medication list for this problem includes:    Loratadine 10 Mg Tabs (Loratadine) .Marland Kitchen... 1 by mouth once daily as needed allergies    Promethazine-codeine 6.25-10 Mg/31ml Syrp (Promethazine-codeine) .Marland Kitchen... 5-10 ml by mouth q id as needed cough  Problem # 2:  SINUSITIS, ACUTE (ICD-461.9) Assessment: New  His updated medication list for this problem includes:    Flonase 50 Mcg/act Susp (Fluticasone propionate) .Marland Kitchen... 1 spr each nostr qd as needed    Amoxicillin 500 Mg Caps (Amoxicillin) .Marland Kitchen... 2 caps by mouth bid    Promethazine-codeine 6.25-10 Mg/60ml Syrp (Promethazine-codeine) .Marland Kitchen... 5-10 ml by mouth q id as needed cough  Problem # 3:  ALLERGIC RHINITIS (ICD-477.9) Assessment: Deteriorated  His updated medication list for this problem includes:    Loratadine 10 Mg Tabs (Loratadine) .Marland Kitchen... 1 by mouth once daily as needed allergies    Flonase 50 Mcg/act Susp (Fluticasone propionate) .Marland Kitchen... 1 spr each nostr qd as needed  Complete Medication List: 1)  Vitamin D  1000 Unit Tabs (Cholecalciferol) .Marland Kitchen.. 1 by mouth qd 2)  Dexilant 60 Mg Cpdr (Dexlansoprazole) .Marland Kitchen.. 1 by mouth q am for indigestion 3)  Loratadine 10 Mg Tabs (Loratadine) .Marland Kitchen.. 1 by mouth once daily as needed allergies 4)  Flonase 50 Mcg/act Susp (Fluticasone propionate) .Marland Kitchen.. 1 spr each nostr qd as needed 5)  Amoxicillin 500 Mg Caps (Amoxicillin) .... 2 caps by mouth bid 6)  Promethazine-codeine 6.25-10 Mg/65ml Syrp  (Promethazine-codeine) .... 5-10 ml by mouth q id as needed cough  Patient Instructions: 1)  Keep return office visit  2)  Pls add to next well labs Allergy Panel Region 2 3)  Sinus rinse Prescriptions: LORATADINE 10 MG TABS (LORATADINE) 1 by mouth once daily as needed allergies  #30 x 6   Entered and Authorized by:   Tresa Garter MD   Signed by:   Tresa Garter MD on 07/11/2010   Method used:   [facility] to        [facility] [address] [phone][facility] (retail)       [address]       [place], [place]  [address]       Ph: [phone] or [phone]       Fax: [phone]   RxID:   [id number] PROMETHAZINE-CODEINE 6.25-10 MG/5ML SYRP (PROMETHAZINE-CODEINE) 5-10 ml by mouth q id as needed cough  #300 ml x 0   Entered and Authorized by:   Tresa Garter MD   Signed by:   Tresa Garter MD on 07/11/2010   Method used:   Print then Give to Patient   RxID:   [id number] AMOXICILLIN 500 MG CAPS (AMOXICILLIN) 2 caps by mouth bid  #40 x 0   Entered and Authorized by:   Tresa Garter MD   Signed by:   Tresa Garter MD on 07/11/2010   Method used:   [facility] to        [facility] [address] [phone]* (retail)       [address]       [place], [place]  [address]       Ph: [phone] or [phone]       Fax: [phone]   RxID:   [id number] FLONASE 50 MCG/ACT SUSP (FLUTICASONE PROPIONATE) 1 spr each nostr qd as needed  #3 x 3   Entered and Authorized by:   Tresa Garter MD   Signed by:   Tresa Garter MD on 07/11/2010   Method used:   [facility] to        [facility] [address] [phone]* (retail)       [address]       [place], [place]  [address]       Ph: [phone] or [phone]       Fax: [phone]   RxID:   [id number]    Orders Added: 1)  Est. Patient Level III [93267]

## 2010-08-22 ENCOUNTER — Encounter: Payer: Self-pay | Admitting: Internal Medicine

## 2010-10-08 LAB — URINALYSIS, ROUTINE W REFLEX MICROSCOPIC
Bilirubin Urine: NEGATIVE
Glucose, UA: NEGATIVE mg/dL
Hgb urine dipstick: NEGATIVE
Protein, ur: NEGATIVE mg/dL
Urobilinogen, UA: 0.2 mg/dL (ref 0.0–1.0)
pH: 6.5 (ref 5.0–8.0)

## 2010-10-08 LAB — POCT I-STAT, CHEM 8
BUN: 13 mg/dL (ref 6–23)
Calcium, Ion: 0.92 mmol/L — ABNORMAL LOW (ref 1.12–1.32)
Chloride: 109 mEq/L (ref 96–112)
Creatinine, Ser: 0.8 mg/dL (ref 0.4–1.5)

## 2010-10-08 LAB — GLUCOSE, CAPILLARY

## 2010-10-08 LAB — TSH: TSH: 0.51 u[IU]/mL (ref 0.350–4.500)

## 2010-10-17 ENCOUNTER — Encounter: Payer: Self-pay | Admitting: Internal Medicine

## 2010-12-08 ENCOUNTER — Other Ambulatory Visit: Payer: Self-pay | Admitting: Internal Medicine

## 2010-12-08 ENCOUNTER — Other Ambulatory Visit: Payer: Self-pay

## 2010-12-08 DIAGNOSIS — Z0000 Encounter for general adult medical examination without abnormal findings: Secondary | ICD-10-CM

## 2010-12-08 DIAGNOSIS — Z0389 Encounter for observation for other suspected diseases and conditions ruled out: Secondary | ICD-10-CM

## 2010-12-12 ENCOUNTER — Encounter: Payer: Self-pay | Admitting: Internal Medicine

## 2011-02-23 ENCOUNTER — Other Ambulatory Visit: Payer: Self-pay

## 2011-02-24 ENCOUNTER — Telehealth: Payer: Self-pay | Admitting: *Deleted

## 2011-02-24 NOTE — Telephone Encounter (Signed)
error 

## 2011-02-26 ENCOUNTER — Encounter: Payer: Self-pay | Admitting: Internal Medicine

## 2011-02-27 ENCOUNTER — Encounter: Payer: Self-pay | Admitting: Internal Medicine

## 2011-05-08 ENCOUNTER — Encounter: Payer: Self-pay | Admitting: Internal Medicine

## 2011-05-28 ENCOUNTER — Ambulatory Visit (INDEPENDENT_AMBULATORY_CARE_PROVIDER_SITE_OTHER): Payer: BC Managed Care – PPO | Admitting: Internal Medicine

## 2011-05-28 ENCOUNTER — Encounter: Payer: Self-pay | Admitting: Internal Medicine

## 2011-05-28 VITALS — BP 136/76 | HR 76 | Temp 98.7°F | Resp 16 | Wt 203.8 lb

## 2011-05-28 DIAGNOSIS — J209 Acute bronchitis, unspecified: Secondary | ICD-10-CM | POA: Insufficient documentation

## 2011-05-28 MED ORDER — HYDROCOD POLST-CPM POLST ER 10-8 MG PO CP12: 1.0000 | ORAL_CAPSULE | Freq: Two times a day (BID) | ORAL | Status: DC | PRN

## 2011-05-28 MED ORDER — AZITHROMYCIN 500 MG PO TABS: 500.0000 mg | ORAL_TABLET | Freq: Every day | ORAL | Status: AC

## 2011-05-28 NOTE — Assessment & Plan Note (Signed)
Start zpak for the infection and tussicaps for the cough

## 2011-05-28 NOTE — Progress Notes (Signed)
Subjective:    Patient ID: William Acosta, male    DOB: 18-Nov-1955, 55 y.o.   MRN: [id number]  Cough This is a new problem. The current episode started in the past 7 days. The problem has been gradually improving. The problem occurs every few hours. The cough is productive of purulent sputum. Associated symptoms include chills, myalgias, rhinorrhea and sweats. Pertinent negatives include no chest pain, ear congestion, ear pain, fever, headaches, heartburn, hemoptysis, nasal congestion, postnasal drip, rash, sore throat, shortness of breath, weight loss or wheezing. The symptoms are aggravated by nothing. Risk factors for lung disease include travel. He has tried nothing for the symptoms. The treatment provided no relief.      Review of Systems  Constitutional: Positive for chills. Negative for fever, weight loss, diaphoresis, activity change, appetite change, fatigue and unexpected weight change.  HENT: Positive for rhinorrhea. Negative for ear pain, sore throat, neck pain and postnasal drip.   Eyes: Negative.   Respiratory: Positive for cough. Negative for apnea, hemoptysis, choking, chest tightness, shortness of breath, wheezing and stridor.   Cardiovascular: Negative for chest pain, palpitations and leg swelling.  Gastrointestinal: Negative for heartburn, nausea, vomiting, abdominal pain, diarrhea and abdominal distention.  Genitourinary: Negative for dysuria, urgency, frequency, hematuria, flank pain, decreased urine volume, enuresis and difficulty urinating.  Musculoskeletal: Positive for myalgias. Negative for joint swelling, arthralgias and gait problem.  Skin: Negative for color change, pallor, rash and wound.  Neurological: Negative for dizziness, tremors, seizures, syncope, facial asymmetry, speech difficulty, weakness, light-headedness, numbness and headaches.  Hematological: Negative for adenopathy. Does not bruise/bleed easily.  Psychiatric/Behavioral: Negative.          Objective:   Physical Exam  Vitals reviewed. Constitutional: He is oriented to person, place, and time. He appears well-developed and well-nourished. No distress.  HENT:  Head: Normocephalic and atraumatic.  Right Ear: Hearing, tympanic membrane, external ear and ear canal normal.  Left Ear: Hearing, tympanic membrane, external ear and ear canal normal.  Nose: Nose normal. No mucosal edema, rhinorrhea, nose lacerations, sinus tenderness, nasal deformity, septal deviation or nasal septal hematoma. No epistaxis.  No foreign bodies. Right sinus exhibits no maxillary sinus tenderness and no frontal sinus tenderness. Left sinus exhibits no maxillary sinus tenderness and no frontal sinus tenderness.  Mouth/Throat: Oropharynx is clear and moist and mucous membranes are normal. Mucous membranes are not pale, not dry and not cyanotic. No oropharyngeal exudate, posterior oropharyngeal edema, posterior oropharyngeal erythema or tonsillar abscesses.  Eyes: Conjunctivae are normal. Right eye exhibits no discharge. Left eye exhibits no discharge. No scleral icterus.  Neck: Normal range of motion. Neck supple. No JVD present. No tracheal deviation present. No thyromegaly present.  Cardiovascular: Normal rate, regular rhythm, normal heart sounds and intact distal pulses.  Exam reveals no gallop and no friction rub.   No murmur heard. Pulmonary/Chest: Effort normal and breath sounds normal. No respiratory distress. He has no wheezes. He has no rales. He exhibits no tenderness.  Abdominal: Soft. Bowel sounds are normal. He exhibits no distension and no mass. There is no tenderness. There is no rebound and no guarding.  Musculoskeletal: Normal range of motion. He exhibits no edema and no tenderness.  Lymphadenopathy:    He has no cervical adenopathy.  Neurological: He is oriented to person, place, and time.  Skin: Skin is warm and dry. No rash noted. He is not diaphoretic. No erythema. No pallor.  Psychiatric:  He has a normal mood and affect. His behavior is normal.  Judgment and thought content normal.          Assessment & Plan:

## 2011-05-28 NOTE — Patient Instructions (Addendum)
Acute Bronchitis You have acute bronchitis. This means you have a chest cold. The airways in your lungs are red and sore (inflamed). Acute means it is sudden onset.  CAUSES Bronchitis is most often caused by the same virus that causes a cold. SYMPTOMS   Body aches.   Chest congestion.   Chills.   Cough.   Fever.   Shortness of breath.   Sore throat.  TREATMENT  Acute bronchitis is usually treated with rest, fluids, and medicines for relief of fever or cough. Most symptoms should go away after a few days or a week. Increased fluids may help thin your secretions and will prevent dehydration. Your caregiver may give you an inhaler to improve your symptoms. The inhaler reduces shortness of breath and helps control cough. You can take over-the-counter pain relievers or cough medicine to decrease coughing, pain, or fever. A cool-air vaporizer may help thin bronchial secretions and make it easier to clear your chest. Antibiotics are usually not needed but can be prescribed if you smoke, are seriously ill, have chronic lung problems, are elderly, or you are at higher risk for developing complications.Allergies and asthma can make bronchitis worse. Repeated episodes of bronchitis may cause longstanding lung problems. Avoid smoking and secondhand smoke.Exposure to cigarette smoke or irritating chemicals will make bronchitis worse. If you are a cigarette smoker, consider using nicotine gum or skin patches to help control withdrawal symptoms. Quitting smoking will help your lungs heal faster. Recovery from bronchitis is often slow, but you should start feeling better after 2 to 3 days. Cough from bronchitis frequently lasts for 3 to 4 weeks. To prevent another bout of acute bronchitis:  Quit smoking.   Wash your hands frequently to get rid of viruses or use a hand sanitizer.   Avoid other people with cold or virus symptoms.   Try not to touch your hands to your mouth, nose, or eyes.  SEEK  IMMEDIATE MEDICAL CARE IF:  You develop increased fever, chills, or chest pain.   You have severe shortness of breath or bloody sputum.   You develop dehydration, fainting, repeated vomiting, or a severe headache.   You have no improvement after 1 week of treatment or you get worse.  MAKE SURE YOU:   Understand these instructions.   Will watch your condition.   Will get help right away if you are not doing well or get worse.  Document Released: 07/23/2004 Document Revised: 02/25/2011 Document Reviewed: 10/08/2010 [facility] Patient Information 2012 [place], [place].Acute Bronchitis You have acute bronchitis. This means you have a chest cold. The airways in your lungs are red and sore (inflamed). Acute means it is sudden onset.  CAUSES Bronchitis is most often caused by the same virus that causes a cold. SYMPTOMS   Body aches.   Chest congestion.   Chills.   Cough.   Fever.   Shortness of breath.   Sore throat.  TREATMENT  Acute bronchitis is usually treated with rest, fluids, and medicines for relief of fever or cough. Most symptoms should go away after a few days or a week. Increased fluids may help thin your secretions and will prevent dehydration. Your caregiver may give you an inhaler to improve your symptoms. The inhaler reduces shortness of breath and helps control cough. You can take over-the-counter pain relievers or cough medicine to decrease coughing, pain, or fever. A cool-air vaporizer may help thin bronchial secretions and make it easier to clear your chest. Antibiotics are usually not needed but can  be prescribed if you smoke, are seriously ill, have chronic lung problems, are elderly, or you are at higher risk for developing complications.Allergies and asthma can make bronchitis worse. Repeated episodes of bronchitis may cause longstanding lung problems. Avoid smoking and secondhand smoke.Exposure to cigarette smoke or irritating chemicals will make bronchitis  worse. If you are a cigarette smoker, consider using nicotine gum or skin patches to help control withdrawal symptoms. Quitting smoking will help your lungs heal faster. Recovery from bronchitis is often slow, but you should start feeling better after 2 to 3 days. Cough from bronchitis frequently lasts for 3 to 4 weeks. To prevent another bout of acute bronchitis:  Quit smoking.   Wash your hands frequently to get rid of viruses or use a hand sanitizer.   Avoid other people with cold or virus symptoms.   Try not to touch your hands to your mouth, nose, or eyes.  SEEK IMMEDIATE MEDICAL CARE IF:  You develop increased fever, chills, or chest pain.   You have severe shortness of breath or bloody sputum.   You develop dehydration, fainting, repeated vomiting, or a severe headache.   You have no improvement after 1 week of treatment or you get worse.  MAKE SURE YOU:   Understand these instructions.   Will watch your condition.   Will get help right away if you are not doing well or get worse.  Document Released: 07/23/2004 Document Revised: 02/25/2011 Document Reviewed: 10/08/2010 [facility] Patient Information 2012 [place], [place].

## 2011-07-31 ENCOUNTER — Encounter: Payer: Self-pay | Admitting: Internal Medicine

## 2011-10-16 ENCOUNTER — Encounter: Payer: Self-pay | Admitting: Internal Medicine

## 2012-01-08 ENCOUNTER — Encounter: Payer: Self-pay | Admitting: Internal Medicine

## 2012-03-25 ENCOUNTER — Encounter: Payer: Self-pay | Admitting: Internal Medicine

## 2012-06-07 ENCOUNTER — Encounter: Payer: Self-pay | Admitting: Internal Medicine

## 2012-07-15 ENCOUNTER — Encounter: Payer: Self-pay | Admitting: Internal Medicine

## 2012-09-21 ENCOUNTER — Ambulatory Visit: Payer: BC Managed Care – PPO | Admitting: Internal Medicine

## 2012-09-23 ENCOUNTER — Encounter: Payer: Self-pay | Admitting: Internal Medicine

## 2012-11-25 ENCOUNTER — Encounter: Payer: Self-pay | Admitting: Internal Medicine

## 2013-01-13 ENCOUNTER — Encounter: Payer: Self-pay | Admitting: Internal Medicine

## 2013-02-02 ENCOUNTER — Telehealth: Payer: Self-pay | Admitting: *Deleted

## 2013-02-02 DIAGNOSIS — Z0000 Encounter for general adult medical examination without abnormal findings: Secondary | ICD-10-CM

## 2013-02-02 DIAGNOSIS — Z125 Encounter for screening for malignant neoplasm of prostate: Secondary | ICD-10-CM

## 2013-02-02 NOTE — Telephone Encounter (Signed)
Entered updated cpx labs...lmb

## 2013-02-02 NOTE — Telephone Encounter (Signed)
Message copied by Deatra James on Thu Feb 02, 2013 11:46 AM ------      Message from: Etheleen Sia      Created: Thu Jan 12, 2013  8:27 AM      Regarding: LABS       SEE IF LAB ORDER FROM 2012 WILL BE GOOD FOR AUG PHYSICAL / HAS CX'D HIS PHYSICAL 10 TIMES STARTING IN 2012 ------

## 2013-02-17 ENCOUNTER — Encounter: Payer: Self-pay | Admitting: Internal Medicine

## 2013-04-28 ENCOUNTER — Encounter: Payer: Self-pay | Admitting: Internal Medicine

## 2013-05-29 ENCOUNTER — Ambulatory Visit (INDEPENDENT_AMBULATORY_CARE_PROVIDER_SITE_OTHER): Payer: BC Managed Care – PPO | Admitting: Internal Medicine

## 2013-05-29 ENCOUNTER — Encounter: Payer: Self-pay | Admitting: Internal Medicine

## 2013-05-29 VITALS — BP 160/102 | HR 72 | Temp 99.1°F | Resp 16 | Wt 211.0 lb

## 2013-05-29 DIAGNOSIS — Z0000 Encounter for general adult medical examination without abnormal findings: Secondary | ICD-10-CM

## 2013-05-29 DIAGNOSIS — R030 Elevated blood-pressure reading, without diagnosis of hypertension: Secondary | ICD-10-CM

## 2013-05-29 DIAGNOSIS — J019 Acute sinusitis, unspecified: Secondary | ICD-10-CM

## 2013-05-29 MED ORDER — LOSARTAN POTASSIUM 100 MG PO TABS: 100.0000 mg | ORAL_TABLET | Freq: Every day | ORAL | Status: DC

## 2013-05-29 MED ORDER — AMOXICILLIN 500 MG PO CAPS: 1000.0000 mg | ORAL_CAPSULE | Freq: Two times a day (BID) | ORAL | Status: DC

## 2013-05-29 NOTE — Patient Instructions (Signed)
Wt Readings from Last 3 Encounters:  05/29/13 211 lb (95.709 kg)  05/28/11 203 lb 12 oz (92.42 kg)  07/11/10 212 lb (96.163 kg)   BP Readings from Last 3 Encounters:  05/29/13 160/102  05/28/11 136/76  07/11/10 150/100   Low salt diet Weight loss

## 2013-05-29 NOTE — Assessment & Plan Note (Signed)
Amoxicillin x10d 

## 2013-05-29 NOTE — Assessment & Plan Note (Signed)
BP Readings from Last 3 Encounters:  05/29/13 160/102  05/28/11 136/76  07/11/10 150/100   Wt Readings from Last 3 Encounters:  05/29/13 211 lb (95.709 kg)  05/28/11 203 lb 12 oz (92.42 kg)  07/11/10 212 lb (96.163 kg)

## 2013-05-29 NOTE — Progress Notes (Signed)
   Subjective:    Patient ID: William Acosta, male    DOB: 02-28-1956, 57 y.o.   MRN: [id number]  HPI C/o elevated BP C/o sinusitis, and a sore in R nostril  Review of Systems  Constitutional: Negative for appetite change, fatigue and unexpected weight change.  HENT: Positive for rhinorrhea and sinus pressure. Negative for congestion, nosebleeds, sneezing, sore throat and trouble swallowing.   Eyes: Negative for itching and visual disturbance.  Respiratory: Negative for cough.   Cardiovascular: Negative for chest pain, palpitations and leg swelling.  Gastrointestinal: Negative for nausea, diarrhea, blood in stool and abdominal distention.  Genitourinary: Negative for frequency and hematuria.  Musculoskeletal: Negative for back pain, gait problem, joint swelling and neck pain.  Skin: Negative for rash.  Neurological: Negative for dizziness, tremors, speech difficulty and weakness.  Psychiatric/Behavioral: Negative for sleep disturbance, dysphoric mood and agitation. The patient is not nervous/anxious.        Objective:   Physical Exam  Constitutional: He is oriented to person, place, and time. He appears well-developed. No distress.  NAD Obese  HENT:  Mouth/Throat: Oropharynx is clear and moist.  Eyes: Conjunctivae are normal. Pupils are equal, round, and reactive to light.  Neck: Normal range of motion. No JVD present. No thyromegaly present.  Cardiovascular: Normal rate, regular rhythm, normal heart sounds and intact distal pulses.  Exam reveals no gallop and no friction rub.   No murmur heard. Pulmonary/Chest: Effort normal and breath sounds normal. No respiratory distress. He has no wheezes. He has no rales. He exhibits no tenderness.  Abdominal: Soft. Bowel sounds are normal. He exhibits no distension and no mass. There is no tenderness. There is no rebound and no guarding.  Musculoskeletal: Normal range of motion. He exhibits no edema and no tenderness.  Lymphadenopathy:   He has no cervical adenopathy.  Neurological: He is alert and oriented to person, place, and time. He has normal reflexes. No cranial nerve deficit. He exhibits normal muscle tone. He displays a negative Romberg sign. Coordination and gait normal.  No meningeal signs  Skin: Skin is warm and dry. No rash noted.  Psychiatric: He has a normal mood and affect. His behavior is normal. Judgment and thought content normal.  eryth nasal mucosa        Assessment & Plan:

## 2013-05-29 NOTE — Progress Notes (Signed)
Pre visit review using our clinic review tool, if applicable. No additional management support is needed unless otherwise documented below in the visit note. 

## 2013-07-10 ENCOUNTER — Ambulatory Visit: Payer: BC Managed Care – PPO | Admitting: Internal Medicine

## 2013-07-14 ENCOUNTER — Encounter: Payer: Self-pay | Admitting: Internal Medicine

## 2013-07-14 ENCOUNTER — Ambulatory Visit (INDEPENDENT_AMBULATORY_CARE_PROVIDER_SITE_OTHER): Payer: BC Managed Care – PPO | Admitting: Internal Medicine

## 2013-07-14 VITALS — BP 128/80 | HR 84 | Temp 97.3°F | Resp 16 | Wt 193.0 lb

## 2013-07-14 DIAGNOSIS — F329 Major depressive disorder, single episode, unspecified: Secondary | ICD-10-CM

## 2013-07-14 DIAGNOSIS — M545 Low back pain, unspecified: Secondary | ICD-10-CM

## 2013-07-14 DIAGNOSIS — IMO0001 Reserved for inherently not codable concepts without codable children: Secondary | ICD-10-CM

## 2013-07-14 DIAGNOSIS — F3289 Other specified depressive episodes: Secondary | ICD-10-CM

## 2013-07-14 DIAGNOSIS — Z0000 Encounter for general adult medical examination without abnormal findings: Secondary | ICD-10-CM

## 2013-07-14 DIAGNOSIS — Z1211 Encounter for screening for malignant neoplasm of colon: Secondary | ICD-10-CM

## 2013-07-14 DIAGNOSIS — R030 Elevated blood-pressure reading, without diagnosis of hypertension: Secondary | ICD-10-CM

## 2013-07-14 DIAGNOSIS — I10 Essential (primary) hypertension: Secondary | ICD-10-CM | POA: Insufficient documentation

## 2013-07-14 NOTE — Progress Notes (Signed)
Pre visit review using our clinic review tool, if applicable. No additional management support is needed unless otherwise documented below in the visit note. 

## 2013-07-14 NOTE — Progress Notes (Signed)
   Subjective:    HPI  The patient is here for a wellness exam. The patient has been doing well overall without major physical or psychological issues going on lately.  C/o elevated BP C/o sinusitis, and a sore in R nostril  BP Readings from Last 3 Encounters:  07/14/13 128/80  05/29/13 160/102  05/28/11 136/76   Wt Readings from Last 3 Encounters:  07/14/13 193 lb (87.544 kg)  05/29/13 211 lb (95.709 kg)  05/28/11 203 lb 12 oz (92.42 kg)      Review of Systems  Constitutional: Negative for appetite change, fatigue and unexpected weight change.  HENT: Positive for rhinorrhea and sinus pressure. Negative for congestion, nosebleeds, sneezing, sore throat and trouble swallowing.   Eyes: Negative for itching and visual disturbance.  Respiratory: Negative for cough.   Cardiovascular: Negative for chest pain, palpitations and leg swelling.  Gastrointestinal: Negative for nausea, diarrhea, blood in stool and abdominal distention.  Genitourinary: Negative for frequency and hematuria.  Musculoskeletal: Negative for back pain, gait problem, joint swelling and neck pain.  Skin: Negative for rash.  Neurological: Negative for dizziness, tremors, speech difficulty and weakness.  Psychiatric/Behavioral: Negative for sleep disturbance, dysphoric mood and agitation. The patient is not nervous/anxious.        Objective:   Physical Exam  Constitutional: He is oriented to person, place, and time. He appears well-developed. No distress.  NAD Obese  HENT:  Mouth/Throat: Oropharynx is clear and moist.  Eyes: Conjunctivae are normal. Pupils are equal, round, and reactive to light.  Neck: Normal range of motion. No JVD present. No thyromegaly present.  Cardiovascular: Normal rate, regular rhythm, normal heart sounds and intact distal pulses.  Exam reveals no gallop and no friction rub.   No murmur heard. Pulmonary/Chest: Effort normal and breath sounds normal. No respiratory distress. He has  no wheezes. He has no rales. He exhibits no tenderness.  Abdominal: Soft. Bowel sounds are normal. He exhibits no distension and no mass. There is no tenderness. There is no rebound and no guarding.  Musculoskeletal: Normal range of motion. He exhibits no edema and no tenderness.  Lymphadenopathy:    He has no cervical adenopathy.  Neurological: He is alert and oriented to person, place, and time. He has normal reflexes. No cranial nerve deficit. He exhibits normal muscle tone. He displays a negative Romberg sign. Coordination and gait normal.  No meningeal signs  Skin: Skin is warm and dry. No rash noted.  Psychiatric: He has a normal mood and affect. His behavior is normal. Judgment and thought content normal.     Lab Results  Component Value Date   WBC 6.1 11/16/2008   HGB 16.1 11/16/2008   HCT 46.8 11/16/2008   PLT 194.0 11/16/2008   GLUCOSE 97 11/16/2008   CHOL 144 11/16/2008   TRIG 79.0 11/16/2008   HDL 32.30* 11/16/2008   LDLCALC 96 11/16/2008   ALT 21 11/16/2008   AST 22 11/16/2008   NA 144 11/16/2008   K 3.8 11/16/2008   CL 107 11/16/2008   CREATININE 1.0 11/16/2008   BUN 14 11/16/2008   CO2 30 11/16/2008   TSH 0.71 11/16/2008   PSA 1.03 11/16/2008   HGBA1C 5.2 06/25/2006        Assessment & Plan:

## 2013-07-14 NOTE — Assessment & Plan Note (Signed)
Much better w/wt loss 

## 2013-07-14 NOTE — Assessment & Plan Note (Signed)
Much better w/wt loss OK to try Losartan 1/2 tabs a day

## 2013-07-14 NOTE — Assessment & Plan Note (Signed)
Better  

## 2013-07-14 NOTE — Assessment & Plan Note (Signed)
Resolved

## 2013-07-26 ENCOUNTER — Encounter: Payer: Self-pay | Admitting: Internal Medicine

## 2013-08-23 ENCOUNTER — Encounter: Payer: Self-pay | Admitting: Internal Medicine

## 2013-09-07 ENCOUNTER — Encounter: Payer: BC Managed Care – PPO | Admitting: Internal Medicine

## 2013-10-09 ENCOUNTER — Encounter: Payer: BC Managed Care – PPO | Admitting: Internal Medicine

## 2014-05-22 ENCOUNTER — Other Ambulatory Visit: Payer: Self-pay | Admitting: Internal Medicine

## 2014-06-08 ENCOUNTER — Encounter: Payer: BC Managed Care – PPO | Admitting: Internal Medicine

## 2014-08-03 ENCOUNTER — Encounter: Payer: Self-pay | Admitting: Family

## 2014-08-03 ENCOUNTER — Ambulatory Visit (INDEPENDENT_AMBULATORY_CARE_PROVIDER_SITE_OTHER): Payer: BC Managed Care – PPO | Admitting: Family

## 2014-08-03 VITALS — BP 130/86 | HR 106 | Temp 98.6°F | Resp 18 | Ht 71.0 in | Wt 214.4 lb

## 2014-08-03 DIAGNOSIS — K649 Unspecified hemorrhoids: Secondary | ICD-10-CM

## 2014-08-03 MED ORDER — HYDROCORTISONE ACETATE 25 MG RE SUPP: 25.0000 mg | Freq: Two times a day (BID) | RECTAL | Status: DC

## 2014-08-03 NOTE — Patient Instructions (Addendum)
Thank you for choosing [facility].  Summary/Instructions:  Start colace (docusate sodium) daily. If loose stools develop stop colace for 2-3 days and then restart.  Your prescription(s) have been submitted to your pharmacy or been printed and provided for you. Please take as directed and contact our office if you believe you are having problem(s) with the medication(s) or have any questions.  If your symptoms worsen or fail to improve, please contact our office for further instruction, or in case of emergency go directly to the emergency room at the closest medical facility.    Hemorrhoids Hemorrhoids are swollen veins around the rectum or anus. There are two types of hemorrhoids:  1. Internal hemorrhoids. These occur in the veins just inside the rectum. They may poke through to the outside and become irritated and painful. 2. External hemorrhoids. These occur in the veins outside the anus and can be felt as a painful swelling or hard lump near the anus. CAUSES  Pregnancy.   Obesity.   Constipation or diarrhea.   Straining to have a bowel movement.   Sitting for long periods on the toilet.  Heavy lifting or other activity that caused you to strain.  Anal intercourse. SYMPTOMS   Pain.   Anal itching or irritation.   Rectal bleeding.   Fecal leakage.   Anal swelling.   One or more lumps around the anus.  DIAGNOSIS  Your caregiver may be able to diagnose hemorrhoids by visual examination. Other examinations or tests that may be performed include:   Examination of the rectal area with a gloved hand (digital rectal exam).   Examination of anal canal using a small tube (scope).   A blood test if you have lost a significant amount of blood.  A test to look inside the colon (sigmoidoscopy or colonoscopy). TREATMENT Most hemorrhoids can be treated at home. However, if symptoms do not seem to be getting better or if you have a lot of rectal bleeding,  your caregiver may perform a procedure to help make the hemorrhoids get smaller or remove them completely. Possible treatments include:   Placing a rubber band at the base of the hemorrhoid to cut off the circulation (rubber band ligation).   Injecting a chemical to shrink the hemorrhoid (sclerotherapy).   Using a tool to burn the hemorrhoid (infrared light therapy).   Surgically removing the hemorrhoid (hemorrhoidectomy).   Stapling the hemorrhoid to block blood flow to the tissue (hemorrhoid stapling).  HOME CARE INSTRUCTIONS   Eat foods with fiber, such as whole grains, beans, nuts, fruits, and vegetables. Ask your doctor about taking products with added fiber in them (fibersupplements).  Increase fluid intake. Drink enough water and fluids to keep your urine clear or pale yellow.   Exercise regularly.   Go to the bathroom when you have the urge to have a bowel movement. Do not wait.   Avoid straining to have bowel movements.   Keep the anal area dry and clean. Use wet toilet paper or moist towelettes after a bowel movement.   Medicated creams and suppositories may be used or applied as directed.   Only take over-the-counter or prescription medicines as directed by your caregiver.   Take warm sitz baths for 15-20 minutes, 3-4 times a day to ease pain and discomfort.   Place ice packs on the hemorrhoids if they are tender and swollen. Using ice packs between sitz baths may be helpful.   Put ice in a plastic bag.   Place a  towel between your skin and the bag.   Leave the ice on for 15-20 minutes, 3-4 times a day.   Do not use a donut-shaped pillow or sit on the toilet for long periods. This increases blood pooling and pain.  SEEK MEDICAL CARE IF:  You have increasing pain and swelling that is not controlled by treatment or medicine.  You have uncontrolled bleeding.  You have difficulty or you are unable to have a bowel movement.  You have pain or  inflammation outside the area of the hemorrhoids. MAKE SURE YOU:  Understand these instructions.  Will watch your condition.  Will get help right away if you are not doing well or get worse. Document Released: 06/12/2000 Document Revised: 06/01/2012 Document Reviewed: 04/19/2012 [facility] Patient Information 2015 [place], [place]. This information is not intended to replace advice given to you by your health care provider. Make sure you discuss any questions you have with your health care provider.  Sitz Bath A sitz bath is a warm water bath taken in the sitting position that covers only the hips and buttocks. It may be used for either healing or hygiene purposes. Sitz baths are also used to relieve pain, itching, or muscle spasms. The water may contain medicine. Moist heat will help you heal and relax.  HOME CARE INSTRUCTIONS  Take 3 to 4 sitz baths a day. 3. Fill the bathtub half full with warm water. 4. Sit in the water and open the drain a little. 5. Turn on the warm water to keep the tub half full. Keep the water running constantly. 6. Soak in the water for 15 to 20 minutes. 7. After the sitz bath, pat the affected area dry first. SEEK MEDICAL CARE IF:  You get worse instead of better. Stop the sitz baths if you get worse. MAKE SURE YOU:  Understand these instructions.  Will watch your condition.  Will get help right away if you are not doing well or get worse. Document Released: 03/07/2004 Document Revised: 03/09/2012 Document Reviewed: 09/12/2010 [facility] Patient Information 2015 [place], [place]. This information is not intended to replace advice given to you by your health care provider. Make sure you discuss any questions you have with your health care provider.

## 2014-08-03 NOTE — Progress Notes (Signed)
Pre visit review using our clinic review tool, if applicable. No additional management support is needed unless otherwise documented below in the visit note. 

## 2014-08-03 NOTE — Progress Notes (Signed)
   Subjective:    Patient ID: William Acosta, male    DOB: [birth year], 59 y.o.   MRN: [id number]  Chief Complaint  Patient presents with  . Hemorrhoids    x2 months, wants to make sure he takes the right stuff OTC due to high BP    HPI:  William Acosta is a 59 y.o. male who presents today for an office visit.  Notes previous anal fissure. Associated symptoms of itching, burning and pain in his rectum that is exacerbated by bowel movements. Notes that he occasionally has bright red blood. Notes that he has bowel movements daily. Occasionally will have hard bowel movements causing additional irritation.  BP Readings from Last 3 Encounters:  08/03/14 130/86  07/14/13 128/80  05/29/13 160/102    Allergies  Allergen Reactions  . Moxifloxacin     REACTION: felt bad    Current Outpatient Prescriptions on File Prior to Visit  Medication Sig Dispense Refill  . losartan (COZAAR) 100 MG tablet TAKE 1 TABLET BY MOUTH EVERY DAY 90 tablet 0   No current facility-administered medications on file prior to visit.    Review of Systems  Gastrointestinal: Positive for constipation (occasional) and anal bleeding (with bowel movements).  Genitourinary: Negative for difficulty urinating.      Objective:    BP 130/86 mmHg  Pulse 106  Temp(Src) 98.6 F (37 C) (Oral)  Resp 18  Ht 5\' 11"  (1.803 m)  Wt 214 lb 6.4 oz (97.251 kg)  BMI 29.92 kg/m2  SpO2 98% Nursing note and vital signs reviewed.  Physical Exam  Constitutional: He is oriented to person, place, and time. He appears well-developed and well-nourished. No distress.  Cardiovascular: Normal rate, regular rhythm, normal heart sounds and intact distal pulses.   Pulmonary/Chest: Effort normal and breath sounds normal.  Genitourinary:  Declines rectal exam  Neurological: He is alert and oriented to person, place, and time.  Skin: Skin is warm and dry.  Psychiatric: He has a normal mood and affect. His behavior is normal. Judgment  and thought content normal.       Assessment & Plan:

## 2014-08-03 NOTE — Assessment & Plan Note (Signed)
Symptoms and exam consistent with hemorrhoids. Start Anusol-HC suppository. Discussed proper anal hygiene including not straining, avoiding excessive wiping, and preventing constipation. Start colace as needed. Follow up if symptoms worsen or fail to improve.

## 2014-08-17 ENCOUNTER — Encounter: Payer: BC Managed Care – PPO | Admitting: Internal Medicine

## 2014-08-17 ENCOUNTER — Other Ambulatory Visit: Payer: Self-pay | Admitting: Internal Medicine

## 2014-08-30 ENCOUNTER — Encounter: Payer: Self-pay | Admitting: Family

## 2014-08-30 ENCOUNTER — Ambulatory Visit (INDEPENDENT_AMBULATORY_CARE_PROVIDER_SITE_OTHER): Payer: BC Managed Care – PPO | Admitting: Family

## 2014-08-30 VITALS — BP 138/86 | HR 107 | Temp 97.9°F | Resp 18 | Ht 71.0 in | Wt 216.4 lb

## 2014-08-30 DIAGNOSIS — J019 Acute sinusitis, unspecified: Secondary | ICD-10-CM

## 2014-08-30 MED ORDER — HYDROCORTISONE ACETATE 25 MG RE SUPP: 25.0000 mg | Freq: Two times a day (BID) | RECTAL | Status: DC | PRN

## 2014-08-30 MED ORDER — AMOXICILLIN-POT CLAVULANATE 875-125 MG PO TABS: 1.0000 | ORAL_TABLET | Freq: Two times a day (BID) | ORAL | Status: DC

## 2014-08-30 NOTE — Progress Notes (Signed)
Pre visit review using our clinic review tool, if applicable. No additional management support is needed unless otherwise documented below in the visit note. 

## 2014-08-30 NOTE — Progress Notes (Signed)
   Subjective:    Patient ID: William Acosta, male    DOB: July 06, 1955, 59 y.o.   MRN: [id number]  Chief Complaint  Patient presents with  . Nasal Congestion    congestion, drainage, ear pressure and fluid, sore throat on and off, x2 weeks     HPI:  William Acosta is a 59 y.o. male who presents today for an acute visit.   This is a new problem. Associated symptoms of congestion, drainage, ear pressure, and on/off sore throat has been going on for about 2 weeks. Has not taken any OTC medication secondary to his blood pressure. Denies any recent antibiotic use.    Allergies  Allergen Reactions  . Moxifloxacin     REACTION: felt bad    Current Outpatient Prescriptions on File Prior to Visit  Medication Sig Dispense Refill  . hydrocortisone (ANUSOL-HC) 25 MG suppository Place 1 suppository (25 mg total) rectally 2 (two) times daily. 12 suppository 0  . losartan (COZAAR) 100 MG tablet TAKE 1 TABLET BY MOUTH EVERY DAY 90 tablet 3   No current facility-administered medications on file prior to visit.    Review of Systems  Constitutional: Positive for chills. Negative for fever.  HENT: Positive for congestion, ear pain, sinus pressure and sore throat.   Respiratory: Negative for cough, chest tightness and shortness of breath.   Neurological: Negative for headaches.      Objective:    BP 138/86 mmHg  Pulse 107  Temp(Src) 97.9 F (36.6 C) (Oral)  Resp 18  Ht 5\' 11"  (1.803 m)  Wt 216 lb 6.4 oz (98.158 kg)  BMI 30.19 kg/m2  SpO2 98% Nursing note and vital signs reviewed.  Physical Exam  Constitutional: He is oriented to person, place, and time. He appears well-developed and well-nourished. No distress.  HENT:  Right Ear: Hearing, tympanic membrane, external ear and ear canal normal.  Left Ear: Hearing, tympanic membrane, external ear and ear canal normal.  Nose: Right sinus exhibits maxillary sinus tenderness and frontal sinus tenderness. Left sinus exhibits maxillary  sinus tenderness and frontal sinus tenderness.  Mouth/Throat: Uvula is midline and mucous membranes are normal. Posterior oropharyngeal erythema present.  Cardiovascular: Normal rate, regular rhythm, normal heart sounds and intact distal pulses.   Pulmonary/Chest: Effort normal and breath sounds normal.  Neurological: He is alert and oriented to person, place, and time.  Skin: Skin is warm and dry.  Psychiatric: He has a normal mood and affect. His behavior is normal. Judgment and thought content normal.       Assessment & Plan:

## 2014-08-30 NOTE — Assessment & Plan Note (Signed)
Symptoms and exam consistent with bacterial sinusitis. Start Augmentin. Continue over-the-counter medications as needed for symptom relief and supportive care. Follow-up if symptoms worsen or fail to improve.

## 2014-08-30 NOTE — Patient Instructions (Signed)
Thank you for choosing  HealthCare.  Summary/Instructions:  Your prescription(s) have been submitted to your pharmacy or been printed and provided for you. Please take as directed and contact our office if you believe you are having problem(s) with the medication(s) or have any questions.  If your symptoms worsen or fail to improve, please contact our office for further instruction, or in case of emergency go directly to the emergency room at the closest medical facility.   General Recommendations:    Please drink plenty of fluids.  Get plenty of rest   Sleep in humidified air  Use saline nasal sprays  Netti pot   OTC Medications:  Decongestants - helps relieve congestion   Flonase (generic fluticasone) or Nasacort (generic triamcinolone) - please make sure to use the "cross-over" technique at a 45 degree angle towards the opposite eye as opposed to straight up the nasal passageway.   If you have HIGH BLOOD PRESSURE - Coricidin HBP; AVOID any product that is -D as this contains pseudoephedrine which may increase your blood pressure.  Afrin (oxymetazoline) every 6-8 hours for up to 3 days.   Allergies - helps relieve runny nose, itchy eyes and sneezing   Claritin (generic loratidine), Allegra (fexofenidine), or Zyrtec (generic cyrterizine) for runny nose. These medications should not cause drowsiness.  Note - Benadryl (generic diphenhydramine) may be used however may cause drowsiness  Cough -   Delsym or Robitussin (generic dextromethorphan)  Expectorants - helps loosen mucus to ease removal   Mucinex (generic guaifenesin) as directed on the package.  Headaches / General Aches   Tylenol (generic acetaminophen) - DO NOT EXCEED 3 grams (3,000 mg) in a 24 hour time period  Advil/Motrin (generic ibuprofen)   Sore Throat -   Salt water gargle   Chloraseptic (generic benzocaine) spray or lozenges / Sucrets (generic dyclonine)      

## 2014-09-28 ENCOUNTER — Encounter: Payer: BC Managed Care – PPO | Admitting: Internal Medicine

## 2014-11-05 ENCOUNTER — Encounter: Payer: BC Managed Care – PPO | Admitting: Internal Medicine

## 2014-12-25 ENCOUNTER — Telehealth: Payer: Self-pay | Admitting: Internal Medicine

## 2014-12-25 ENCOUNTER — Encounter: Payer: BC Managed Care – PPO | Admitting: Internal Medicine

## 2014-12-25 DIAGNOSIS — Z0000 Encounter for general adult medical examination without abnormal findings: Secondary | ICD-10-CM

## 2014-12-25 NOTE — Telephone Encounter (Signed)
Labs ordered. Left detailed mess informing pt.

## 2014-12-25 NOTE — Telephone Encounter (Signed)
Patient was scheduled today for physical but his car broke down last night so we rescheduled for 7/26. He is wondering if he can get some lab work ordered before his appointment

## 2015-01-14 ENCOUNTER — Other Ambulatory Visit (INDEPENDENT_AMBULATORY_CARE_PROVIDER_SITE_OTHER): Payer: BC Managed Care – PPO

## 2015-01-14 DIAGNOSIS — Z0000 Encounter for general adult medical examination without abnormal findings: Secondary | ICD-10-CM | POA: Diagnosis not present

## 2015-01-14 LAB — CBC WITH DIFFERENTIAL/PLATELET
BASOS PCT: 0.3 % (ref 0.0–3.0)
Basophils Absolute: 0 10*3/uL (ref 0.0–0.1)
Eosinophils Absolute: 0.1 10*3/uL (ref 0.0–0.7)
Eosinophils Relative: 1.4 % (ref 0.0–5.0)
HCT: 49 % (ref 39.0–52.0)
Hemoglobin: 16.5 g/dL (ref 13.0–17.0)
Lymphocytes Relative: 35.2 % (ref 12.0–46.0)
Lymphs Abs: 3.3 10*3/uL (ref 0.7–4.0)
MCHC: 33.7 g/dL (ref 30.0–36.0)
MCV: 91.9 fl (ref 78.0–100.0)
MONOS PCT: 5.1 % (ref 3.0–12.0)
Monocytes Absolute: 0.5 10*3/uL (ref 0.1–1.0)
NEUTROS ABS: 5.5 10*3/uL (ref 1.4–7.7)
Neutrophils Relative %: 58 % (ref 43.0–77.0)
Platelets: 221 10*3/uL (ref 150.0–400.0)
RBC: 5.34 Mil/uL (ref 4.22–5.81)
RDW: 13.5 % (ref 11.5–15.5)
WBC: 9.5 10*3/uL (ref 4.0–10.5)

## 2015-01-14 LAB — URINALYSIS, ROUTINE W REFLEX MICROSCOPIC
Bilirubin Urine: NEGATIVE
Hgb urine dipstick: NEGATIVE
Ketones, ur: NEGATIVE
LEUKOCYTES UA: NEGATIVE
Nitrite: NEGATIVE
PH: 6 (ref 5.0–8.0)
SPECIFIC GRAVITY, URINE: 1.02 (ref 1.000–1.030)
TOTAL PROTEIN, URINE-UPE24: NEGATIVE
Urine Glucose: NEGATIVE
Urobilinogen, UA: 0.2 (ref 0.0–1.0)

## 2015-01-14 LAB — HEPATIC FUNCTION PANEL
ALT: 13 U/L (ref 0–53)
AST: 15 U/L (ref 0–37)
Albumin: 4.1 g/dL (ref 3.5–5.2)
Alkaline Phosphatase: 96 U/L (ref 39–117)
BILIRUBIN TOTAL: 0.8 mg/dL (ref 0.2–1.2)
Bilirubin, Direct: 0.1 mg/dL (ref 0.0–0.3)
Total Protein: 6.6 g/dL (ref 6.0–8.3)

## 2015-01-14 LAB — LIPID PANEL
Cholesterol: 183 mg/dL (ref 0–200)
HDL: 42 mg/dL (ref >39.00)
LDL CALC: 109 mg/dL — AB (ref 0–99)
NonHDL: 141
TRIGLYCERIDES: 159 mg/dL — AB (ref 0.0–149.0)
Total CHOL/HDL Ratio: 4
VLDL: 31.8 mg/dL (ref 0.0–40.0)

## 2015-01-14 LAB — PSA: PSA: 2.16 ng/mL (ref 0.10–4.00)

## 2015-01-14 LAB — TSH: TSH: 0.88 u[IU]/mL (ref 0.35–4.50)

## 2015-01-14 LAB — BASIC METABOLIC PANEL
BUN: 12 mg/dL (ref 6–23)
CALCIUM: 9.4 mg/dL (ref 8.4–10.5)
CO2: 29 mEq/L (ref 19–32)
Chloride: 102 mEq/L (ref 96–112)
Creatinine, Ser: 0.93 mg/dL (ref 0.40–1.50)
GFR: 87.75 mL/min (ref >60.00)
Glucose, Bld: 94 mg/dL (ref 70–99)
Potassium: 4.1 mEq/L (ref 3.5–5.1)
Sodium: 140 mEq/L (ref 135–145)

## 2015-01-22 ENCOUNTER — Encounter: Payer: Self-pay | Admitting: Internal Medicine

## 2015-01-22 ENCOUNTER — Ambulatory Visit (INDEPENDENT_AMBULATORY_CARE_PROVIDER_SITE_OTHER): Payer: BC Managed Care – PPO | Admitting: Internal Medicine

## 2015-01-22 VITALS — BP 130/72 | HR 75 | Ht 70.0 in | Wt 213.0 lb

## 2015-01-22 DIAGNOSIS — R972 Elevated prostate specific antigen [PSA]: Secondary | ICD-10-CM | POA: Diagnosis not present

## 2015-01-22 DIAGNOSIS — Z1211 Encounter for screening for malignant neoplasm of colon: Secondary | ICD-10-CM

## 2015-01-22 DIAGNOSIS — Z658 Other specified problems related to psychosocial circumstances: Secondary | ICD-10-CM

## 2015-01-22 DIAGNOSIS — F439 Reaction to severe stress, unspecified: Secondary | ICD-10-CM

## 2015-01-22 DIAGNOSIS — Z0000 Encounter for general adult medical examination without abnormal findings: Secondary | ICD-10-CM | POA: Insufficient documentation

## 2015-01-22 DIAGNOSIS — F411 Generalized anxiety disorder: Secondary | ICD-10-CM

## 2015-01-22 MED ORDER — ERGOCALCIFEROL 1.25 MG (50000 UT) PO CAPS: 50000.0000 [IU] | ORAL_CAPSULE | ORAL | Status: DC

## 2015-01-22 NOTE — Patient Instructions (Signed)

## 2015-01-22 NOTE — Progress Notes (Signed)
Pre visit review using our clinic review tool, if applicable. No additional management support is needed unless otherwise documented below in the visit note. 

## 2015-01-22 NOTE — Progress Notes (Signed)
Subjective:  Patient ID: William Acosta, male    DOB: [birth year]  Age: 59 y.o. MRN: [id number]  CC: Annual Exam   HPI Alp Goldwater Tuch presents for a well exam. C/o stress  Outpatient Prescriptions Prior to Visit  Medication Sig Dispense Refill  . losartan (COZAAR) 100 MG tablet TAKE 1 TABLET BY MOUTH EVERY DAY 90 tablet 3  . amoxicillin-clavulanate (AUGMENTIN) 875-125 MG per tablet Take 1 tablet by mouth 2 (two) times daily. 20 tablet 0  . hydrocortisone (ANUSOL-HC) 25 MG suppository Place 1 suppository (25 mg total) rectally 2 (two) times daily as needed for hemorrhoids or itching. (Patient not taking: Reported on 01/22/2015) 12 suppository 0   No facility-administered medications prior to visit.    ROS Review of Systems  Constitutional: Negative for appetite change, fatigue and unexpected weight change.  HENT: Negative for congestion, nosebleeds, sneezing, sore throat and trouble swallowing.   Eyes: Negative for itching and visual disturbance.  Respiratory: Negative for cough.   Cardiovascular: Negative for chest pain, palpitations and leg swelling.  Gastrointestinal: Negative for nausea, diarrhea, blood in stool and abdominal distention.  Genitourinary: Negative for urgency, frequency, hematuria and decreased urine volume.  Musculoskeletal: Negative for back pain, joint swelling, gait problem and neck pain.  Skin: Negative for rash.  Neurological: Negative for dizziness, tremors, speech difficulty and weakness.  Psychiatric/Behavioral: Negative for suicidal ideas, sleep disturbance, dysphoric mood, decreased concentration and agitation. The patient is nervous/anxious.     Objective:  BP 130/72 mmHg  Pulse 75  Ht 5\' 10"  (1.778 m)  Wt 213 lb (96.616 kg)  BMI 30.56 kg/m2  SpO2 97%  BP Readings from Last 3 Encounters:  01/22/15 130/72  08/30/14 138/86  08/03/14 130/86    Wt Readings from Last 3 Encounters:  01/22/15 213 lb (96.616 kg)  08/30/14 216 lb 6.4 oz (98.158  kg)  08/03/14 214 lb 6.4 oz (97.251 kg)    Physical Exam  Constitutional: He is oriented to person, place, and time. He appears well-developed. No distress.  NAD  HENT:  Mouth/Throat: Oropharynx is clear and moist.  Eyes: Conjunctivae are normal. Pupils are equal, round, and reactive to light.  Neck: Normal range of motion. No JVD present. No thyromegaly present.  Cardiovascular: Normal rate, regular rhythm, normal heart sounds and intact distal pulses.  Exam reveals no gallop and no friction rub.   No murmur heard. Pulmonary/Chest: Effort normal and breath sounds normal. No respiratory distress. He has no wheezes. He has no rales. He exhibits no tenderness.  Abdominal: Soft. Bowel sounds are normal. He exhibits no distension and no mass. There is no tenderness. There is no rebound and no guarding.  Genitourinary: Rectum normal.  Musculoskeletal: Normal range of motion. He exhibits no edema or tenderness.  Lymphadenopathy:    He has no cervical adenopathy.  Neurological: He is alert and oriented to person, place, and time. He has normal reflexes. No cranial nerve deficit. He exhibits normal muscle tone. He displays a negative Romberg sign. Coordination and gait normal.  Skin: Skin is warm and dry. No rash noted.  Psychiatric: He has a normal mood and affect. His behavior is normal. Judgment and thought content normal.  Prostate a little enlarged  Lab Results  Component Value Date   WBC 9.5 01/14/2015   HGB 16.5 01/14/2015   HCT 49.0 01/14/2015   PLT 221.0 01/14/2015   GLUCOSE 94 01/14/2015   CHOL 183 01/14/2015   TRIG 159.0* 01/14/2015   HDL 42.00 01/14/2015  LDLCALC 109* 01/14/2015   ALT 13 01/14/2015   AST 15 01/14/2015   NA 140 01/14/2015   K 4.1 01/14/2015   CL 102 01/14/2015   CREATININE 0.93 01/14/2015   BUN 12 01/14/2015   CO2 29 01/14/2015   TSH 0.88 01/14/2015   PSA 2.16 01/14/2015   HGBA1C 5.2 06/25/2006    No results found.  Assessment & Plan:   Zyaire  was seen today for annual exam.  Diagnoses and all orders for this visit:  Well adult exam Orders: -     EKG 12-Lead  Stress Orders: -     PSA, total and free; Future -     Testosterone; Future  Anxiety state  Elevated PSA measurement Orders: -     PSA, total and free; Future  Other orders -     ergocalciferol (VITAMIN D2) 50000 UNITS capsule; Take 1 capsule (50,000 Units total) by mouth every 30 (thirty) days.  I have discontinued Mr. Labriola's amoxicillin-clavulanate. I am also having him start on ergocalciferol. Additionally, I am having him maintain his losartan and hydrocortisone.  Meds ordered this encounter  Medications  . ergocalciferol (VITAMIN D2) 50000 UNITS capsule    Sig: Take 1 capsule (50,000 Units total) by mouth every 30 (thirty) days.    Dispense:  6 capsule    Refill:  3   EKG RBBB NSR  Follow-up: Return in about 6 months (around 07/25/2015) for a follow-up visit.  Walker Kehr, MD

## 2015-01-22 NOTE — Assessment & Plan Note (Signed)
We discussed age appropriate health related issues, including available/recomended screening tests and vaccinations. We discussed a need for adhering to healthy diet and exercise. Labs/EKG were reviewed/ordered. All questions were answered.   

## 2015-01-22 NOTE — Assessment & Plan Note (Addendum)
Testosterone check 

## 2015-01-25 NOTE — Addendum Note (Signed)
Addended by: Cassandria Anger on: 01/25/2015 01:11 PM   Modules accepted: Orders, SmartSet

## 2015-02-01 ENCOUNTER — Telehealth: Payer: Self-pay | Admitting: Internal Medicine

## 2015-02-01 NOTE — Telephone Encounter (Signed)
Last colon appears to have been in 1999. Pt had a colon scheduled for March and April of 2015 but cancelled the appointments. Pt is overdue for his colon.

## 2015-06-26 ENCOUNTER — Ambulatory Visit (INDEPENDENT_AMBULATORY_CARE_PROVIDER_SITE_OTHER): Payer: BC Managed Care – PPO | Admitting: Family

## 2015-06-26 ENCOUNTER — Encounter: Payer: Self-pay | Admitting: Family

## 2015-06-26 VITALS — BP 140/78 | HR 97 | Temp 98.0°F | Resp 16 | Ht 70.0 in | Wt 215.0 lb

## 2015-06-26 DIAGNOSIS — J069 Acute upper respiratory infection, unspecified: Secondary | ICD-10-CM | POA: Diagnosis not present

## 2015-06-26 MED ORDER — AZITHROMYCIN 250 MG PO TABS: ORAL_TABLET | ORAL | Status: DC

## 2015-06-26 MED ORDER — BENZONATATE 100 MG PO CAPS: 100.0000 mg | ORAL_CAPSULE | Freq: Three times a day (TID) | ORAL | Status: DC

## 2015-06-26 MED ORDER — HYDROCODONE-HOMATROPINE 5-1.5 MG/5ML PO SYRP: 5.0000 mL | ORAL_SOLUTION | Freq: Three times a day (TID) | ORAL | Status: DC | PRN

## 2015-06-26 NOTE — Patient Instructions (Signed)
Thank you for choosing [facility].  Summary/Instructions:  Your prescription(s) have been submitted to your pharmacy or been printed and provided for you. Please take as directed and contact our office if you believe you are having problem(s) with the medication(s) or have any questions.  If your symptoms worsen or fail to improve, please contact our office for further instruction, or in case of emergency go directly to the emergency room at the closest medical facility.    General Recommendations:    Please drink plenty of fluids.  Get plenty of rest   Sleep in humidified air  Use saline nasal sprays  Netti pot   OTC Medications:  Decongestants - helps relieve congestion   Flonase (generic fluticasone) or Nasacort (generic triamcinolone) - please make sure to use the "cross-over" technique at a 45 degree angle towards the opposite eye as opposed to straight up the nasal passageway.   Sudafed (generic pseudoephedrine - Note this is the one that is available behind the pharmacy counter); Products with phenylephrine (-PE) may also be used but is often not as effective as pseudoephedrine.   If you have HIGH BLOOD PRESSURE - Coricidin HBP; AVOID any product that is -D as this contains pseudoephedrine which may increase your blood pressure.  Afrin (oxymetazoline) every 6-8 hours for up to 3 days.   Allergies - helps relieve runny nose, itchy eyes and sneezing   Claritin (generic loratidine), Allegra (fexofenidine), or Zyrtec (generic cyrterizine) for runny nose. These medications should not cause drowsiness.  Note - Benadryl (generic diphenhydramine) may be used however may cause drowsiness  Cough -   Delsym or Robitussin (generic dextromethorphan)  Expectorants - helps loosen mucus to ease removal   Mucinex (generic guaifenesin) as directed on the package.  Headaches / General Aches   Tylenol (generic acetaminophen) - DO NOT EXCEED 3 grams (3,000 mg) in a 24  hour time period  Advil/Motrin (generic ibuprofen)   Sore Throat -   Salt water gargle   Chloraseptic (generic benzocaine) spray or lozenges / Sucrets (generic dyclonine)    Upper Respiratory Infection, Adult Most upper respiratory infections (URIs) are a viral infection of the air passages leading to the lungs. A URI affects the nose, throat, and upper air passages. The most common type of URI is nasopharyngitis and is typically referred to as "the common cold." URIs run their course and usually go away on their own. Most of the time, a URI does not require medical attention, but sometimes a bacterial infection in the upper airways can follow a viral infection. This is called a secondary infection. Sinus and middle ear infections are common types of secondary upper respiratory infections. Bacterial pneumonia can also complicate a URI. A URI can worsen asthma and chronic obstructive pulmonary disease (COPD). Sometimes, these complications can require emergency medical care and may be life threatening.  CAUSES Almost all URIs are caused by viruses. A virus is a type of germ and can spread from one person to another.  RISKS FACTORS You may be at risk for a URI if:   You smoke.   You have chronic heart or lung disease.  You have a weakened defense (immune) system.   You are very young or very old.   You have nasal allergies or asthma.  You work in crowded or poorly ventilated areas.  You work in health care facilities or schools. SIGNS AND SYMPTOMS  Symptoms typically develop 2-3 days after you come in contact with a cold virus. Most   viral URIs last 7-10 days. However, viral URIs from the influenza virus (flu virus) can last 14-18 days and are typically more severe. Symptoms may include:   Runny or stuffy (congested) nose.   Sneezing.   Cough.   Sore throat.   Headache.   Fatigue.   Fever.   Loss of appetite.   Pain in your forehead, behind your eyes, and  over your cheekbones (sinus pain).  Muscle aches.  DIAGNOSIS  Your health care provider may diagnose a URI by:  Physical exam.  Tests to check that your symptoms are not due to another condition such as:  Strep throat.  Sinusitis.  Pneumonia.  Asthma. TREATMENT  A URI goes away on its own with time. It cannot be cured with medicines, but medicines may be prescribed or recommended to relieve symptoms. Medicines may help:  Reduce your fever.  Reduce your cough.  Relieve nasal congestion. HOME CARE INSTRUCTIONS   Take medicines only as directed by your health care provider.   Gargle warm saltwater or take cough drops to comfort your throat as directed by your health care provider.  Use a warm mist humidifier or inhale steam from a shower to increase air moisture. This may make it easier to breathe.  Drink enough fluid to keep your urine clear or pale yellow.   Eat soups and other clear broths and maintain good nutrition.   Rest as needed.   Return to work when your temperature has returned to normal or as your health care provider advises. You may need to stay home longer to avoid infecting others. You can also use a face mask and careful hand washing to prevent spread of the virus.  Increase the usage of your inhaler if you have asthma.   Do not use any tobacco products, including cigarettes, chewing tobacco, or electronic cigarettes. If you need help quitting, ask your health care provider. PREVENTION  The best way to protect yourself from getting a cold is to practice good hygiene.   Avoid oral or hand contact with people with cold symptoms.   Wash your hands often if contact occurs.  There is no clear evidence that vitamin C, vitamin E, echinacea, or exercise reduces the chance of developing a cold. However, it is always recommended to get plenty of rest, exercise, and practice good nutrition.  SEEK MEDICAL CARE IF:   You are getting worse rather than  better.   Your symptoms are not controlled by medicine.   You have chills.  You have worsening shortness of breath.  You have brown or red mucus.  You have yellow or brown nasal discharge.  You have pain in your face, especially when you bend forward.  You have a fever.  You have swollen neck glands.  You have pain while swallowing.  You have white areas in the back of your throat. SEEK IMMEDIATE MEDICAL CARE IF:   You have severe or persistent:  Headache.  Ear pain.  Sinus pain.  Chest pain.  You have chronic lung disease and any of the following:  Wheezing.  Prolonged cough.  Coughing up blood.  A change in your usual mucus.  You have a stiff neck.  You have changes in your:  Vision.  Hearing.  Thinking.  Mood. MAKE SURE YOU:   Understand these instructions.  Will watch your condition.  Will get help right away if you are not doing well or get worse.   This information is not intended to replace advice   given to you by your health care provider. Make sure you discuss any questions you have with your health care provider.   Document Released: 12/09/2000 Document Revised: 10/30/2014 Document Reviewed: 09/20/2013 Elsevier Interactive Patient Education 2016 [employer]  

## 2015-06-26 NOTE — Progress Notes (Signed)
Subjective:    Patient ID: William Acosta, male    DOB: 02-14-1956, 59 y.o.   MRN: [id number]  Chief Complaint  Patient presents with  . Cough    sore throat, runny nose, productive cough, started on christmas    HPI:  William Acosta is a 59 y.o. male who  has a past medical history of Allergic rhinitis; Hyperlipidemia; Multinodular goiter; GERD (gastroesophageal reflux disease); and Depression. and presents today for an acute office visit.  This is a new problem. Associated symptoms of sore throat, runny nose, and productive cough hemming going on for approximately 3 days. Modifying factors include Augmentin that was previously prescribed about a month ago. Denies any modifying factors in attempt to make his symptoms better. Felt like he had a subjective fever. Timing of symptoms are worse at night.   Allergies  Allergen Reactions  . Moxifloxacin     REACTION: felt bad    Current Outpatient Prescriptions on File Prior to Visit  Medication Sig Dispense Refill  . ergocalciferol (VITAMIN D2) 50000 UNITS capsule Take 1 capsule (50,000 Units total) by mouth every 30 (thirty) days. 6 capsule 3  . losartan (COZAAR) 100 MG tablet TAKE 1 TABLET BY MOUTH EVERY DAY 90 tablet 3   No current facility-administered medications on file prior to visit.    Review of Systems  Constitutional: Positive for fever (subjective).  HENT: Positive for congestion, ear pain, rhinorrhea, sinus pressure and sore throat.   Respiratory: Positive for cough. Negative for chest tightness and shortness of breath.   Neurological: Positive for headaches.      Objective:    BP 140/78 mmHg  Pulse 97  Temp(Src) 98 F (36.7 C) (Oral)  Resp 16  Ht 5\' 10"  (1.778 m)  Wt 215 lb (97.523 kg)  BMI 30.85 kg/m2  SpO2 97% Nursing note and vital signs reviewed.  Physical Exam  Constitutional: He is oriented to person, place, and time. He appears well-developed and well-nourished. No distress.  HENT:  Right Ear:  Hearing, tympanic membrane, external ear and ear canal normal.  Left Ear: Hearing, tympanic membrane, external ear and ear canal normal.  Nose: Right sinus exhibits maxillary sinus tenderness and frontal sinus tenderness. Left sinus exhibits maxillary sinus tenderness and frontal sinus tenderness.  Mouth/Throat: Uvula is midline, oropharynx is clear and moist and mucous membranes are normal.  Neck: Neck supple.  Cardiovascular: Normal rate, regular rhythm, normal heart sounds and intact distal pulses.   Pulmonary/Chest: Effort normal and breath sounds normal.  Neurological: He is alert and oriented to person, place, and time.  Skin: Skin is warm and dry.  Psychiatric: He has a normal mood and affect. His behavior is normal. Judgment and thought content normal.       Assessment & Plan:   Problem List Items Addressed This Visit      Respiratory   Acute upper respiratory infection - Primary    Symptoms and exam consistent with acute upper respiratory infection however cannot rule out underlying developing sinusitis. Start Hycodan as needed for cough and sleep. Start Tessalon as needed for cough during the day. Continue over-the-counter medications as needed for symptom relief and supportive care. Written prescription for azithromycin provided if symptoms do not improve in the next 2-3 days with watchful waiting discussed. Follow-up if symptoms worsen or fail to improve with completion of the azithromycin if needed.      Relevant Medications   azithromycin (ZITHROMAX) 250 MG tablet   benzonatate (TESSALON) 100  MG capsule   HYDROcodone-homatropine (HYCODAN) 5-1.5 MG/5ML syrup

## 2015-06-26 NOTE — Assessment & Plan Note (Signed)
Symptoms and exam consistent with acute upper respiratory infection however cannot rule out underlying developing sinusitis. Start Hycodan as needed for cough and sleep. Start Tessalon as needed for cough during the day. Continue over-the-counter medications as needed for symptom relief and supportive care. Written prescription for azithromycin provided if symptoms do not improve in the next 2-3 days with watchful waiting discussed. Follow-up if symptoms worsen or fail to improve with completion of the azithromycin if needed.

## 2015-06-26 NOTE — Progress Notes (Signed)
Pre visit review using our clinic review tool, if applicable. No additional management support is needed unless otherwise documented below in the visit note. 

## 2015-07-03 ENCOUNTER — Ambulatory Visit: Payer: BC Managed Care – PPO | Admitting: Family

## 2015-07-05 ENCOUNTER — Ambulatory Visit (INDEPENDENT_AMBULATORY_CARE_PROVIDER_SITE_OTHER): Payer: BC Managed Care – PPO | Admitting: Family

## 2015-07-05 ENCOUNTER — Encounter: Payer: Self-pay | Admitting: Family

## 2015-07-05 VITALS — BP 130/82 | HR 70 | Temp 97.5°F | Resp 18 | Ht 70.0 in | Wt 219.0 lb

## 2015-07-05 DIAGNOSIS — R0781 Pleurodynia: Secondary | ICD-10-CM | POA: Diagnosis not present

## 2015-07-05 NOTE — Patient Instructions (Signed)
Thank you for choosing [facility].  Summary/Instructions:  If your symptoms worsen or fail to improve, please contact our office for further instruction, or in case of emergency go directly to the emergency room at the closest medical facility.   Ibuprofen as needed for discomfort.   Pepcid, Zantac or Prilosec as needed.   Costochondritis Costochondritis, sometimes called Tietze syndrome, is a swelling and irritation (inflammation) of the tissue (cartilage) that connects your ribs with your breastbone (sternum). It causes pain in the chest and rib area. Costochondritis usually goes away on its own over time. It can take up to 6 weeks or longer to get better, especially if you are unable to limit your activities. CAUSES  Some cases of costochondritis have no known cause. Possible causes include:  Injury (trauma).  Exercise or activity such as lifting.  Severe coughing. SIGNS AND SYMPTOMS  Pain and tenderness in the chest and rib area.  Pain that gets worse when coughing or taking deep breaths.  Pain that gets worse with specific movements. DIAGNOSIS  Your health care provider will do a physical exam and ask about your symptoms. Chest X-rays or other tests may be done to rule out other problems. TREATMENT  Costochondritis usually goes away on its own over time. Your health care provider may prescribe medicine to help relieve pain. HOME CARE INSTRUCTIONS   Avoid exhausting physical activity. Try not to strain your ribs during normal activity. This would include any activities using chest, abdominal, and side muscles, especially if heavy weights are used.  Apply ice to the affected area for the first 2 days after the pain begins.  Put ice in a plastic bag.  Place a towel between your skin and the bag.  Leave the ice on for 20 minutes, 2-3 times a day.  Only take over-the-counter or prescription medicines as directed by your health care provider. SEEK MEDICAL CARE  IF:  You have redness or swelling at the rib joints. These are signs of infection.  Your pain does not go away despite rest or medicine. SEEK IMMEDIATE MEDICAL CARE IF:   Your pain increases or you are very uncomfortable.  You have shortness of breath or difficulty breathing.  You cough up blood.  You have worse chest pains, sweating, or vomiting.  You have a fever or persistent symptoms for more than 2-3 days.  You have a fever and your symptoms suddenly get worse. MAKE SURE YOU:   Understand these instructions.  Will watch your condition.  Will get help right away if you are not doing well or get worse.   This information is not intended to replace advice given to you by your health care provider. Make sure you discuss any questions you have with your health care provider.   Document Released: 03/25/2005 Document Revised: 04/05/2013 Document Reviewed: 01/17/2013 Elsevier Interactive Patient Education [employer].

## 2015-07-05 NOTE — Progress Notes (Signed)
   Subjective:    Patient ID: William Acosta, male    DOB: 1955/11/01, 61 y.o.   MRN: [id number]  Chief Complaint  Patient presents with  . Flank Pain    still does have the side pains when coughing, has tried not to cough bc of the pain and feels like he gets short of breath     HPI:  William Acosta is a 60 y.o. male who  has a past medical history of Allergic rhinitis; Hyperlipidemia; Multinodular goiter; GERD (gastroesophageal reflux disease); and Depression. and presents today for a follow-up office visit.   Recently seen in the office for an acute upper respiratory infection and watchful waiting prescription of azithromycin. Did complete the course of azithromycin as prescribed. Reports the cough is improved following the antibiotic. Currently using Delsym as needed. Now experiencing side pain mainly when he coughs that is located on the side of his ribs and described as a pulling. The course of the symptoms are improving. Denies fevers.   Allergies  Allergen Reactions  . Moxifloxacin     REACTION: felt bad     Current Outpatient Prescriptions on File Prior to Visit  Medication Sig Dispense Refill  . benzonatate (TESSALON) 100 MG capsule Take 1 capsule (100 mg total) by mouth 3 (three) times daily. 30 capsule 0  . ergocalciferol (VITAMIN D2) 50000 UNITS capsule Take 1 capsule (50,000 Units total) by mouth every 30 (thirty) days. 6 capsule 3  . HYDROcodone-homatropine (HYCODAN) 5-1.5 MG/5ML syrup Take 5 mLs by mouth every 8 (eight) hours as needed for cough. 120 mL 0  . losartan (COZAAR) 100 MG tablet TAKE 1 TABLET BY MOUTH EVERY DAY 90 tablet 3   No current facility-administered medications on file prior to visit.    Review of Systems  Constitutional: Negative for fever and chills.  HENT: Negative for congestion.   Respiratory: Positive for cough. Negative for chest tightness and shortness of breath.   Cardiovascular: Negative for chest pain, palpitations and leg swelling.    Neurological: Negative for headaches.      Objective:    BP 130/82 mmHg  Pulse 70  Temp(Src) 97.5 F (36.4 C) (Oral)  Resp 18  Ht 5\' 10"  (1.778 m)  Wt 219 lb (99.338 kg)  BMI 31.42 kg/m2  SpO2 96% Nursing note and vital signs reviewed.  Physical Exam  Constitutional: He is oriented to person, place, and time. He appears well-developed and well-nourished. No distress.  Cardiovascular: Normal rate, regular rhythm, normal heart sounds and intact distal pulses.   Pulmonary/Chest: Effort normal and breath sounds normal.  Chest wall - no obvious deformity, discoloration, or edema noted. No tenderness able to be elicited. No masses or crepitus. Rib compression test negative.  Neurological: He is alert and oriented to person, place, and time.  Skin: Skin is warm and dry.  Psychiatric: He has a normal mood and affect. His behavior is normal. Judgment and thought content normal.       Assessment & Plan:   Problem List Items Addressed This Visit      Other   Rib pain - Primary    Symptoms and exam consistent with costochondritis most likely related to coughing and sneezing. Treat conservatively with over-the-counter medications as needed for symptom relief and supportive care. Follow-up if symptoms worsen or fail to improve.

## 2015-07-05 NOTE — Progress Notes (Signed)
Pre visit review using our clinic review tool, if applicable. No additional management support is needed unless otherwise documented below in the visit note. 

## 2015-07-05 NOTE — Assessment & Plan Note (Signed)
Symptoms and exam consistent with costochondritis most likely related to coughing and sneezing. Treat conservatively with over-the-counter medications as needed for symptom relief and supportive care. Follow-up if symptoms worsen or fail to improve.

## 2015-07-26 ENCOUNTER — Ambulatory Visit: Payer: BC Managed Care – PPO | Admitting: Internal Medicine

## 2015-08-12 ENCOUNTER — Other Ambulatory Visit: Payer: Self-pay | Admitting: Internal Medicine

## 2015-09-26 ENCOUNTER — Encounter: Payer: Self-pay | Admitting: Family

## 2015-09-26 ENCOUNTER — Ambulatory Visit (INDEPENDENT_AMBULATORY_CARE_PROVIDER_SITE_OTHER): Payer: BC Managed Care – PPO | Admitting: Family

## 2015-09-26 ENCOUNTER — Ambulatory Visit: Payer: BC Managed Care – PPO | Admitting: Family

## 2015-09-26 VITALS — BP 118/70 | HR 75 | Temp 98.2°F | Resp 16 | Ht 70.0 in | Wt 211.0 lb

## 2015-09-26 DIAGNOSIS — J019 Acute sinusitis, unspecified: Secondary | ICD-10-CM | POA: Diagnosis not present

## 2015-09-26 MED ORDER — HYDROCOD POLST-CPM POLST ER 10-8 MG/5ML PO SUER: 5.0000 mL | Freq: Every evening | ORAL | Status: DC | PRN

## 2015-09-26 MED ORDER — AMOXICILLIN-POT CLAVULANATE 875-125 MG PO TABS: 1.0000 | ORAL_TABLET | Freq: Two times a day (BID) | ORAL | Status: DC

## 2015-09-26 NOTE — Assessment & Plan Note (Signed)
Symptoms consistent with bacterial sinusitis. Start Augmentin. Start Tussionex as needed for cough and sleep. Continue over-the-counter medications as needed for symptom relief and supportive care. Follow-up if symptoms worsen or fail to improve.

## 2015-09-26 NOTE — Patient Instructions (Signed)
Thank you for choosing [facility].  Summary/Instructions:  Your prescription(s) have been submitted to your pharmacy or been printed and provided for you. Please take as directed and contact our office if you believe you are having problem(s) with the medication(s) or have any questions.  If your symptoms worsen or fail to improve, please contact our office for further instruction, or in case of emergency go directly to the emergency room at the closest medical facility.   General Recommendations:    Please drink plenty of fluids.  Get plenty of rest   Sleep in humidified air  Use saline nasal sprays  Netti pot   OTC Medications:  Decongestants - helps relieve congestion   Flonase (generic fluticasone) or Nasacort (generic triamcinolone) - please make sure to use the "cross-over" technique at a 45 degree angle towards the opposite eye as opposed to straight up the nasal passageway.   Sudafed (generic pseudoephedrine - Note this is the one that is available behind the pharmacy counter); Products with phenylephrine (-PE) may also be used but is often not as effective as pseudoephedrine.   If you have HIGH BLOOD PRESSURE - Coricidin HBP; AVOID any product that is -D as this contains pseudoephedrine which may increase your blood pressure.  Afrin (oxymetazoline) every 6-8 hours for up to 3 days.   Allergies - helps relieve runny nose, itchy eyes and sneezing   Claritin (generic loratidine), Allegra (fexofenidine), or Zyrtec (generic cyrterizine) for runny nose. These medications should not cause drowsiness.  Note - Benadryl (generic diphenhydramine) may be used however may cause drowsiness  Cough -   Delsym or Robitussin (generic dextromethorphan)  Expectorants - helps loosen mucus to ease removal   Mucinex (generic guaifenesin) as directed on the package.  Headaches / General Aches   Tylenol (generic acetaminophen) - DO NOT EXCEED 3 grams (3,000 mg) in a 24  hour time period  Advil/Motrin (generic ibuprofen)   Sore Throat -   Salt water gargle   Chloraseptic (generic benzocaine) spray or lozenges / Sucrets (generic dyclonine)    Sinusitis Sinusitis is redness, soreness, and inflammation of the paranasal sinuses. Paranasal sinuses are air pockets within the bones of your face (beneath the eyes, the middle of the forehead, or above the eyes). In healthy paranasal sinuses, mucus is able to drain out, and air is able to circulate through them by way of your nose. However, when your paranasal sinuses are inflamed, mucus and air can become trapped. This can allow bacteria and other germs to grow and cause infection. Sinusitis can develop quickly and last only a short time (acute) or continue over a long period (chronic). Sinusitis that lasts for more than 12 weeks is considered chronic.  CAUSES  Causes of sinusitis include:  Allergies.  Structural abnormalities, such as displacement of the cartilage that separates your nostrils (deviated septum), which can decrease the air flow through your nose and sinuses and affect sinus drainage.  Functional abnormalities, such as when the small hairs (cilia) that line your sinuses and help remove mucus do not work properly or are not present. SIGNS AND SYMPTOMS  Symptoms of acute and chronic sinusitis are the same. The primary symptoms are pain and pressure around the affected sinuses. Other symptoms include:  Upper toothache.  Earache.  Headache.  Bad breath.  Decreased sense of smell and taste.  A cough, which worsens when you are lying flat.  Fatigue.  Fever.  Thick drainage from your nose, which often is green and may   contain pus (purulent).  Swelling and warmth over the affected sinuses. DIAGNOSIS  Your health care provider will perform a physical exam. During the exam, your health care provider may:  Look in your nose for signs of abnormal growths in your nostrils (nasal  polyps).  Tap over the affected sinus to check for signs of infection.  View the inside of your sinuses (endoscopy) using an imaging device that has a light attached (endoscope). If your health care provider suspects that you have chronic sinusitis, one or more of the following tests may be recommended:  Allergy tests.  Nasal culture. A sample of mucus is taken from your nose, sent to a lab, and screened for bacteria.  Nasal cytology. A sample of mucus is taken from your nose and examined by your health care provider to determine if your sinusitis is related to an allergy. TREATMENT  Most cases of acute sinusitis are related to a viral infection and will resolve on their own within 10 days. Sometimes medicines are prescribed to help relieve symptoms (pain medicine, decongestants, nasal steroid sprays, or saline sprays).  However, for sinusitis related to a bacterial infection, your health care provider will prescribe antibiotic medicines. These are medicines that will help kill the bacteria causing the infection.  Rarely, sinusitis is caused by a fungal infection. In theses cases, your health care provider will prescribe antifungal medicine. For some cases of chronic sinusitis, surgery is needed. Generally, these are cases in which sinusitis recurs more than 3 times per year, despite other treatments. HOME CARE INSTRUCTIONS   Drink plenty of water. Water helps thin the mucus so your sinuses can drain more easily.  Use a humidifier.  Inhale steam 3 to 4 times a day (for example, sit in the bathroom with the shower running).  Apply a warm, moist washcloth to your face 3 to 4 times a day, or as directed by your health care provider.  Use saline nasal sprays to help moisten and clean your sinuses.  Take medicines only as directed by your health care provider.  If you were prescribed either an antibiotic or antifungal medicine, finish it all even if you start to feel better. SEEK IMMEDIATE  MEDICAL CARE IF:  You have increasing pain or severe headaches.  You have nausea, vomiting, or drowsiness.  You have swelling around your face.  You have vision problems.  You have a stiff neck.  You have difficulty breathing. MAKE SURE YOU:   Understand these instructions.  Will watch your condition.  Will get help right away if you are not doing well or get worse. Document Released: 06/15/2005 Document Revised: 10/30/2013 Document Reviewed: 06/30/2011 ExitCare Patient Information 2015 [employer]. This information is not intended to replace advice given to you by your health care provider. Make sure you discuss any questions you have with your health care provider.   

## 2015-09-26 NOTE — Progress Notes (Signed)
Pre visit review using our clinic review tool, if applicable. No additional management support is needed unless otherwise documented below in the visit note. 

## 2015-09-26 NOTE — Progress Notes (Signed)
   Subjective:    Patient ID: William Acosta Ticer, male    DOB: 24-May-1956, 60 y.o.   MRN: [id number]  Chief Complaint  Patient presents with  . Cough    starting last weekend, was feeling weak, and now has congestion and productive cough, pain in left ear    HPI:  William Acosta is a 60 y.o. male who  has a past medical history of Allergic rhinitis; Hyperlipidemia; Multinodular goiter; GERD (gastroesophageal reflux disease); and Depression. and presents today for an acute office visit.   This is a new problem. Associated symptom of congestion, productive cough, left ear pain, dental pain and sinus pressure that has been going on for about 1 week. No fevers. Denies any modifying factors/treatments that make it better. Course has been steady and minimal improvement since initial onset.    Allergies  Allergen Reactions  . Moxifloxacin     REACTION: felt bad     Current Outpatient Prescriptions on File Prior to Visit  Medication Sig Dispense Refill  . ergocalciferol (VITAMIN D2) 50000 UNITS capsule Take 1 capsule (50,000 Units total) by mouth every 30 (thirty) days. 6 capsule 3  . losartan (COZAAR) 100 MG tablet TAKE 1 TABLET BY MOUTH EVERY DAY 90 tablet 1   No current facility-administered medications on file prior to visit.    Review of Systems  Constitutional: Negative for fever and chills.  HENT: Positive for congestion, ear pain, sinus pressure and sore throat.   Respiratory: Positive for cough. Negative for chest tightness, shortness of breath and wheezing.   Cardiovascular: Negative for chest pain, palpitations and leg swelling.  Neurological: Positive for headaches.      Objective:    BP 118/70 mmHg  Pulse 75  Temp(Src) 98.2 F (36.8 C) (Oral)  Resp 16  Ht 5\' 10"  (1.778 m)  Wt 211 lb (95.709 kg)  BMI 30.28 kg/m2  SpO2 96% Nursing note and vital signs reviewed.  Physical Exam  Constitutional: He is oriented to person, place, and time. He appears well-developed  and well-nourished. No distress.  HENT:  Right Ear: Ear canal normal.  Left Ear: Hearing, tympanic membrane, external ear and ear canal normal.  Nose: Right sinus exhibits maxillary sinus tenderness. Left sinus exhibits maxillary sinus tenderness.  Mouth/Throat: Uvula is midline, oropharynx is clear and moist and mucous membranes are normal.  Impacted cerumen noted in right ear.   Cardiovascular: Normal rate, regular rhythm, normal heart sounds and intact distal pulses.   Pulmonary/Chest: Effort normal and breath sounds normal.  Neurological: He is alert and oriented to person, place, and time.  Skin: Skin is warm and dry.  Psychiatric: He has a normal mood and affect. His behavior is normal. Judgment and thought content normal.       Assessment & Plan:   Problem List Items Addressed This Visit      Respiratory   Acute sinusitis - Primary    Symptoms consistent with bacterial sinusitis. Start Augmentin. Start Tussionex as needed for cough and sleep. Continue over-the-counter medications as needed for symptom relief and supportive care. Follow-up if symptoms worsen or fail to improve.      Relevant Medications   chlorpheniramine-HYDROcodone (TUSSIONEX PENNKINETIC ER) 10-8 MG/5ML SUER   amoxicillin-clavulanate (AUGMENTIN) 875-125 MG tablet

## 2016-02-05 ENCOUNTER — Other Ambulatory Visit: Payer: Self-pay | Admitting: Internal Medicine

## 2016-02-05 NOTE — Telephone Encounter (Signed)
sch OV pls 

## 2016-02-07 ENCOUNTER — Other Ambulatory Visit: Payer: Self-pay | Admitting: Internal Medicine

## 2016-02-13 ENCOUNTER — Encounter: Payer: Self-pay | Admitting: Internal Medicine

## 2016-02-13 ENCOUNTER — Ambulatory Visit (INDEPENDENT_AMBULATORY_CARE_PROVIDER_SITE_OTHER): Payer: BC Managed Care – PPO | Admitting: Internal Medicine

## 2016-02-13 DIAGNOSIS — J019 Acute sinusitis, unspecified: Secondary | ICD-10-CM

## 2016-02-13 DIAGNOSIS — I10 Essential (primary) hypertension: Secondary | ICD-10-CM

## 2016-02-13 DIAGNOSIS — F411 Generalized anxiety disorder: Secondary | ICD-10-CM

## 2016-02-13 MED ORDER — AMOXICILLIN-POT CLAVULANATE 875-125 MG PO TABS: 1.0000 | ORAL_TABLET | Freq: Two times a day (BID) | ORAL | 0 refills | Status: DC

## 2016-02-13 MED ORDER — ERGOCALCIFEROL 1.25 MG (50000 UT) PO CAPS: 50000.0000 [IU] | ORAL_CAPSULE | ORAL | 3 refills | Status: DC

## 2016-02-13 NOTE — Progress Notes (Signed)
Pre visit review using our clinic review tool, if applicable. No additional management support is needed unless otherwise documented below in the visit note. 

## 2016-02-13 NOTE — Progress Notes (Signed)
Subjective:  Patient ID: William Acosta, male    DOB: 09-Jun-1956  Age: 60 y.o. MRN: [id number]  CC: Ear Pain (Bilateral) and Facial Pain (x 1 week)   HPI William Acosta presents for HA, sinus sx's,   Outpatient Medications Prior to Visit  Medication Sig Dispense Refill  . ergocalciferol (VITAMIN D2) 50000 UNITS capsule Take 1 capsule (50,000 Units total) by mouth every 30 (thirty) days. 6 capsule 3  . losartan (COZAAR) 100 MG tablet TAKE 1 TABLET BY MOUTH EVERY DAY 90 tablet 1  . amoxicillin-clavulanate (AUGMENTIN) 875-125 MG tablet Take 1 tablet by mouth 2 (two) times daily. 20 tablet 0  . chlorpheniramine-HYDROcodone (TUSSIONEX PENNKINETIC ER) 10-8 MG/5ML SUER Take 5 mLs by mouth at bedtime as needed. 115 mL 0   No facility-administered medications prior to visit.     ROS Review of Systems  Constitutional: Negative for appetite change, fatigue and unexpected weight change.  HENT: Negative for congestion, nosebleeds, sneezing, sore throat and trouble swallowing.   Eyes: Negative for itching and visual disturbance.  Respiratory: Negative for cough.   Cardiovascular: Negative for chest pain, palpitations and leg swelling.  Gastrointestinal: Negative for abdominal distention, blood in stool, diarrhea and nausea.  Genitourinary: Negative for frequency and hematuria.  Musculoskeletal: Negative for back pain, gait problem, joint swelling and neck pain.  Skin: Negative for rash.  Neurological: Negative for dizziness, tremors, speech difficulty and weakness.  Psychiatric/Behavioral: Negative for agitation, dysphoric mood and sleep disturbance. The patient is nervous/anxious.     Objective:  BP 130/80   Pulse 66   Temp 98.8 F (37.1 C) (Oral)   Wt 218 lb (98.9 kg)   SpO2 97%   BMI 31.28 kg/m   BP Readings from Last 3 Encounters:  02/13/16 130/80  09/26/15 118/70  07/05/15 130/82    Wt Readings from Last 3 Encounters:  02/13/16 218 lb (98.9 kg)  09/26/15 211 lb (95.7  kg)  07/05/15 219 lb (99.3 kg)    Physical Exam  Constitutional: He is oriented to person, place, and time. He appears well-developed. No distress.  NAD  HENT:  Mouth/Throat: Oropharynx is clear and moist.  Eyes: Conjunctivae are normal. Pupils are equal, round, and reactive to light.  Neck: Normal range of motion. No JVD present. No thyromegaly present.  Cardiovascular: Normal rate, regular rhythm, normal heart sounds and intact distal pulses.  Exam reveals no gallop and no friction rub.   No murmur heard. Pulmonary/Chest: Effort normal and breath sounds normal. No respiratory distress. He has no wheezes. He has no rales. He exhibits no tenderness.  Abdominal: Soft. Bowel sounds are normal. He exhibits no distension and no mass. There is no tenderness. There is no rebound and no guarding.  Musculoskeletal: Normal range of motion. He exhibits no edema or tenderness.  Lymphadenopathy:    He has no cervical adenopathy.  Neurological: He is alert and oriented to person, place, and time. He has normal reflexes. No cranial nerve deficit. He exhibits normal muscle tone. He displays a negative Romberg sign. Coordination and gait normal.  Skin: Skin is warm and dry. No rash noted.  Psychiatric: He has a normal mood and affect. His behavior is normal. Judgment and thought content normal.    Lab Results  Component Value Date   WBC 9.5 01/14/2015   HGB 16.5 01/14/2015   HCT 49.0 01/14/2015   PLT 221.0 01/14/2015   GLUCOSE 94 01/14/2015   CHOL 183 01/14/2015   TRIG 159.0 (H) 01/14/2015  HDL 42.00 01/14/2015   LDLCALC 109 (H) 01/14/2015   ALT 13 01/14/2015   AST 15 01/14/2015   NA 140 01/14/2015   K 4.1 01/14/2015   CL 102 01/14/2015   CREATININE 0.93 01/14/2015   BUN 12 01/14/2015   CO2 29 01/14/2015   TSH 0.88 01/14/2015   PSA 2.16 01/14/2015   HGBA1C 5.2 06/25/2006    No results found.  Assessment & Plan:   There are no diagnoses linked to this encounter. I have  discontinued [name]'s chlorpheniramine-HYDROcodone and amoxicillin-clavulanate. I am also having him maintain his ergocalciferol and losartan.  No orders of the defined types were placed in this encounter.    Follow-up: No Follow-up on file.  Walker Kehr, MD

## 2016-02-16 NOTE — Assessment & Plan Note (Signed)
Losartan 

## 2016-02-16 NOTE — Assessment & Plan Note (Signed)
augmentin

## 2016-02-16 NOTE — Assessment & Plan Note (Signed)
better 

## 2016-03-19 ENCOUNTER — Other Ambulatory Visit (INDEPENDENT_AMBULATORY_CARE_PROVIDER_SITE_OTHER): Payer: BC Managed Care – PPO

## 2016-03-19 ENCOUNTER — Encounter: Payer: Self-pay | Admitting: Family

## 2016-03-19 ENCOUNTER — Ambulatory Visit (INDEPENDENT_AMBULATORY_CARE_PROVIDER_SITE_OTHER): Payer: BC Managed Care – PPO | Admitting: Family

## 2016-03-19 ENCOUNTER — Other Ambulatory Visit: Payer: Self-pay | Admitting: Family

## 2016-03-19 DIAGNOSIS — R5383 Other fatigue: Secondary | ICD-10-CM | POA: Insufficient documentation

## 2016-03-19 DIAGNOSIS — R7989 Other specified abnormal findings of blood chemistry: Secondary | ICD-10-CM

## 2016-03-19 LAB — CBC
HCT: 48.5 % (ref 39.0–52.0)
HEMOGLOBIN: 16.7 g/dL (ref 13.0–17.0)
MCHC: 34.5 g/dL (ref 30.0–36.0)
MCV: 90.1 fl (ref 78.0–100.0)
PLATELETS: 265 10*3/uL (ref 150.0–400.0)
RBC: 5.38 Mil/uL (ref 4.22–5.81)
RDW: 13.1 % (ref 11.5–15.5)
WBC: 9.2 10*3/uL (ref 4.0–10.5)

## 2016-03-19 LAB — COMPREHENSIVE METABOLIC PANEL
ALBUMIN: 4.1 g/dL (ref 3.5–5.2)
ALK PHOS: 87 U/L (ref 39–117)
ALT: 17 U/L (ref 0–53)
AST: 15 U/L (ref 0–37)
BILIRUBIN TOTAL: 0.7 mg/dL (ref 0.2–1.2)
BUN: 11 mg/dL (ref 6–23)
CALCIUM: 9.3 mg/dL (ref 8.4–10.5)
CO2: 31 meq/L (ref 19–32)
Chloride: 104 mEq/L (ref 96–112)
Creatinine, Ser: 1.02 mg/dL (ref 0.40–1.50)
GFR: 78.57 mL/min (ref >60.00)
Glucose, Bld: 94 mg/dL (ref 70–99)
Potassium: 4.6 mEq/L (ref 3.5–5.1)
Sodium: 140 mEq/L (ref 135–145)
TOTAL PROTEIN: 7.1 g/dL (ref 6.0–8.3)

## 2016-03-19 LAB — VITAMIN B12: VITAMIN B 12: 185 pg/mL — AB (ref 211–911)

## 2016-03-19 LAB — HEMOGLOBIN A1C: HEMOGLOBIN A1C: 5.5 % (ref 4.6–6.5)

## 2016-03-19 LAB — TESTOSTERONE: TESTOSTERONE: 235.14 ng/dL — AB (ref 300.00–890.00)

## 2016-03-19 NOTE — Progress Notes (Signed)
Subjective:    Patient ID: William Acosta, male    DOB: September 24, 1955, 60 y.o.   MRN: [id number]  Chief Complaint  Patient presents with  . Fatigue    dry mouth, fatigue, feels like he has a slow heart rate, thinks it has something to do with his losartan, wants to make sure his potassium and other blood levels are good "does not feel like self"    HPI:  William Acosta is a 60 y.o. male who  has a past medical history of Allergic rhinitis; Depression; GERD (gastroesophageal reflux disease); Hyperlipidemia; and Multinodular goiter. and presents today fo an office follow up.  This is new problem. Associated symptoms of dry mouth, fatigue, and feeling like he has a slow heart. He has concern that his losartan may be making him feel this way. These symptoms have been going on for a couple of months. Does feel rested after he sleeps. Does occasionally have numbness and tingling on occasion. Blood pressures have been well controlled with no symptoms of end organ damage or worst headache of life.   Allergies  Allergen Reactions  . Moxifloxacin     REACTION: felt bad      Outpatient Medications Prior to Visit  Medication Sig Dispense Refill  . ergocalciferol (VITAMIN D2) 50000 units capsule Take 1 capsule (50,000 Units total) by mouth every 30 (thirty) days. 12 capsule 3  . losartan (COZAAR) 100 MG tablet TAKE 1 TABLET BY MOUTH EVERY DAY 90 tablet 1  . amoxicillin-clavulanate (AUGMENTIN) 875-125 MG tablet Take 1 tablet by mouth 2 (two) times daily. 20 tablet 0   No facility-administered medications prior to visit.       Past Surgical History:  Procedure Laterality Date  . SALIVARY GLAND SURGERY     Right    Past Medical History:  Diagnosis Date  . Allergic rhinitis   . Depression   . GERD (gastroesophageal reflux disease)   . Hyperlipidemia   . Multinodular goiter     Review of Systems  Constitutional: Negative for chills and fever.  Respiratory: Negative for chest  tightness and shortness of breath.   Cardiovascular: Negative for chest pain, palpitations and leg swelling.  Endocrine: Negative for polydipsia, polyphagia and polyuria.      Objective:    BP 122/80 (BP Location: Left Arm, Patient Position: Sitting, Cuff Size: Large)   Pulse 87   Temp 98.6 F (37 C) (Oral)   Resp 16   Ht _0  (1.778 m)   Wt 207 lb 6.4 oz (94.1 kg)   SpO2 97%   BMI 29.76 kg/m  Nursing note and vital signs reviewed.  Physical Exam  Constitutional: He is oriented to person, place, and time. He appears well-developed and well-nourished. No distress.  Cardiovascular: Normal rate, regular rhythm, normal heart sounds and intact distal pulses.   Pulmonary/Chest: Effort normal and breath sounds normal.  Neurological: He is alert and oriented to person, place, and time.  Skin: Skin is warm and dry.  Psychiatric: He has a normal mood and affect. His behavior is normal. Judgment and thought content normal.       Assessment & Plan:   Problem List Items Addressed This Visit      Other   Fatigue    Symptoms of fatigue of undetermined origin with differentials including metabolic, psychologic, or cardiovascular. Obtain testosterone, A1c, complete metabolic profile, CBC, and B12 to rule out metabolic causes. Cannot rule out underlying depression as patient has significant history for  depression. Continue current medications and follow-up pending blood work results for additional follow-up and treatment.      Relevant Orders   Testosterone   CBC   Comp Met (CMET)   B12   Hemoglobin A1c    Other Visit Diagnoses   None.      I have discontinued Mr. Brokaw's amoxicillin-clavulanate. I am also having him maintain his losartan and ergocalciferol.   Follow-up: Return if symptoms worsen or fail to improve.  Mauricio Po, FNP

## 2016-03-19 NOTE — Assessment & Plan Note (Signed)
Symptoms of fatigue of undetermined origin with differentials including metabolic, psychologic, or cardiovascular. Obtain testosterone, A1c, complete metabolic profile, CBC, and B12 to rule out metabolic causes. Cannot rule out underlying depression as patient has significant history for depression. Continue current medications and follow-up pending blood work results for additional follow-up and treatment.

## 2016-03-19 NOTE — Patient Instructions (Signed)
Thank you for choosing [facility].  SUMMARY AND INSTRUCTIONS:  Medication:  Please continue to take your medications as prescribed.   Your prescription(s) have been submitted to your pharmacy or been printed and provided for you. Please take as directed and contact our office if you believe you are having problem(s) with the medication(s) or have any questions.  Labs:  Please stop by the lab on the lower level of the building for your blood work. Your results will be released to [facility] (or called to you) after review, usually within 72 hours after test completion. If any changes need to be made, you will be notified at that same time.  1.) The lab is open from 7:30am to 5:30 pm Monday-Friday 2.) No appointment is necessary 3.) Fasting (if needed) is 6-8 hours after food and drink; black coffee and water are okay   Follow up:  If your symptoms worsen or fail to improve, please contact our office for further instruction, or in case of emergency go directly to the emergency room at the closest medical facility.   Fatigue Fatigue is feeling tired all of the time, a lack of energy, or a lack of motivation. Occasional or mild fatigue is often a normal response to activity or life in general. However, long-lasting (chronic) or extreme fatigue may indicate an underlying medical condition. HOME CARE INSTRUCTIONS  Watch your fatigue for any changes. The following actions may help to lessen any discomfort you are feeling:  Talk to your health care provider about how much sleep you need each night. Try to get the required amount every night.  Take medicines only as directed by your health care provider.  Eat a healthy and nutritious diet. Ask your health care provider if you need help changing your diet.  Drink enough fluid to keep your urine clear or pale yellow.  Practice ways of relaxing, such as yoga, meditation, massage therapy, or acupuncture.  Exercise regularly.   Change  situations that cause you stress. Try to keep your work and personal routine reasonable.  Do not abuse illegal drugs.  Limit alcohol intake to no more than 1 drink per day for nonpregnant women and 2 drinks per day for men. One drink equals 12 ounces of beer, 5 ounces of wine, or 1 ounces of hard liquor.  Take a multivitamin, if directed by your health care provider. SEEK MEDICAL CARE IF:   Your fatigue does not get better.  You have a fever.   You have unintentional weight loss or gain.  You have headaches.   You have difficulty:   Falling asleep.  Sleeping throughout the night.  You feel angry, guilty, anxious, or sad.   You are unable to have a bowel movement (constipation).   You skin is dry.   Your legs or another part of your body is swollen.  SEEK IMMEDIATE MEDICAL CARE IF:   You feel confused.   Your vision is blurry.  You feel faint or pass out.   You have a severe headache.   You have severe abdominal, pelvic, or back pain.   You have chest pain, shortness of breath, or an irregular or fast heartbeat.   You are unable to urinate or you urinate less than normal.   You develop abnormal bleeding, such as bleeding from the rectum, vagina, nose, lungs, or nipples.  You vomit blood.   You have thoughts about harming yourself or committing suicide.   You are worried that you might harm someone  else.    This information is not intended to replace advice given to you by your health care provider. Make sure you discuss any questions you have with your health care provider.   Document Released: 04/12/2007 Document Revised: 07/06/2014 Document Reviewed: 10/17/2013 Elsevier Interactive Patient Education [employer].

## 2016-03-24 ENCOUNTER — Encounter: Payer: Self-pay | Admitting: Family

## 2016-03-24 ENCOUNTER — Other Ambulatory Visit (INDEPENDENT_AMBULATORY_CARE_PROVIDER_SITE_OTHER): Payer: BC Managed Care – PPO

## 2016-03-24 DIAGNOSIS — E291 Testicular hypofunction: Secondary | ICD-10-CM | POA: Diagnosis not present

## 2016-03-24 DIAGNOSIS — R7989 Other specified abnormal findings of blood chemistry: Secondary | ICD-10-CM

## 2016-03-24 LAB — TESTOSTERONE: Testosterone: 247.24 ng/dL — ABNORMAL LOW (ref 300.00–890.00)

## 2016-03-26 ENCOUNTER — Ambulatory Visit (INDEPENDENT_AMBULATORY_CARE_PROVIDER_SITE_OTHER): Payer: BC Managed Care – PPO | Admitting: Family

## 2016-03-26 ENCOUNTER — Encounter: Payer: Self-pay | Admitting: Family

## 2016-03-26 VITALS — BP 154/84 | HR 90 | Temp 98.8°F | Resp 16 | Ht 70.0 in | Wt 210.8 lb

## 2016-03-26 DIAGNOSIS — R7989 Other specified abnormal findings of blood chemistry: Secondary | ICD-10-CM | POA: Insufficient documentation

## 2016-03-26 DIAGNOSIS — E291 Testicular hypofunction: Secondary | ICD-10-CM

## 2016-03-26 DIAGNOSIS — E538 Deficiency of other specified B group vitamins: Secondary | ICD-10-CM | POA: Diagnosis not present

## 2016-03-26 MED ORDER — CYANOCOBALAMIN 1000 MCG/ML IJ SOLN
1000.0000 ug | Freq: Once | INTRAMUSCULAR | Status: AC
  Administered 2016-03-26: 1000 ug via INTRAMUSCULAR

## 2016-03-26 NOTE — Patient Instructions (Addendum)
Thank you for choosing [facility].  SUMMARY AND INSTRUCTIONS:  Medication:  Please continue to take your medication as prescribed.   Follow up in 1 month for another B12 injection.   Your prescription(s) have been submitted to your pharmacy or been printed and provided for you. Please take as directed and contact our office if you believe you are having problem(s) with the medication(s) or have any questions.  Follow up:  If your symptoms worsen or fail to improve, please contact our office for further instruction, or in case of emergency go directly to the emergency room at the closest medical facility.

## 2016-03-26 NOTE — Progress Notes (Signed)
   Subjective:    Patient ID: William Acosta, male    DOB: 15-Jan-1956, 60 y.o.   MRN: [id number]  Chief Complaint  Patient presents with  . Follow-up    questions on blood work    HPI:  William Acosta is a 60 y.o. male who  has a past medical history of Allergic rhinitis; Depression; GERD (gastroesophageal reflux disease); Hyperlipidemia; and Multinodular goiter. and presents today for a follow up office visit.   Previously evaluated for fatigue with blood work showing a low Vitamin B12 and testosterone. Testosterone was repeated and remained low. He feels refreshed in the morning when he wakes up. Timing of the symptoms are generally worse in the evening. Reports that his symptoms have improved slightly.   Allergies  Allergen Reactions  . Moxifloxacin     REACTION: felt bad      Outpatient Medications Prior to Visit  Medication Sig Dispense Refill  . ergocalciferol (VITAMIN D2) 50000 units capsule Take 1 capsule (50,000 Units total) by mouth every 30 (thirty) days. 12 capsule 3  . losartan (COZAAR) 100 MG tablet TAKE 1 TABLET BY MOUTH EVERY DAY 90 tablet 1   No facility-administered medications prior to visit.      Review of Systems  Constitutional: Negative for chills and fever.  Respiratory: Negative for chest tightness and shortness of breath.   Cardiovascular: Negative for chest pain, palpitations and leg swelling.      Objective:    BP (!) 154/84 (BP Location: Left Arm, Patient Position: Sitting, Cuff Size: Normal)   Pulse 90   Temp 98.8 F (37.1 C) (Oral)   Resp 16   Ht 5\' 10"  (1.778 m)   Wt 210 lb 12.8 oz (95.6 kg)   SpO2 97%   BMI 30.25 kg/m  Nursing note and vital signs reviewed.  Physical Exam  Constitutional: He is oriented to person, place, and time. He appears well-developed and well-nourished. No distress.  Cardiovascular: Normal rate, regular rhythm, normal heart sounds and intact distal pulses.   Pulmonary/Chest: Effort normal and breath  sounds normal.  Neurological: He is alert and oriented to person, place, and time.  Skin: Skin is warm and dry.  Psychiatric: He has a normal mood and affect. His behavior is normal. Judgment and thought content normal.       Assessment & Plan:   Problem List Items Addressed This Visit      Digestive   B12 deficiency    Previously noted to have fatigue with B12 and testosterone deficiencies. In office injection of B12 provided. Recommend secondary dose in one month and continuation as needed. Symptoms of fatigue appear improved since previous office visit. Continue to monitor.      Relevant Medications   cyanocobalamin ((VITAMIN B-12)) injection 1,000 mcg (Completed)     Other   Low testosterone - Primary    Patient noted to have 2 separate low testosterone lab values between 8-10am. Recommend holding testosterone therapy and continue to monitor with follow up if symptoms worsen.        Other Visit Diagnoses   None.      I am having Mr. Kalisch maintain his losartan and ergocalciferol. We administered cyanocobalamin.   Follow-up: Return if symptoms worsen or fail to improve.  Mauricio Po, FNP

## 2016-03-26 NOTE — Assessment & Plan Note (Signed)
Previously noted to have fatigue with B12 and testosterone deficiencies. In office injection of B12 provided. Recommend secondary dose in one month and continuation as needed. Symptoms of fatigue appear improved since previous office visit. Continue to monitor.

## 2016-03-26 NOTE — Assessment & Plan Note (Signed)
Patient noted to have 2 separate low testosterone lab values between 8-10am. Recommend holding testosterone therapy and continue to monitor with follow up if symptoms worsen.

## 2016-04-27 ENCOUNTER — Ambulatory Visit (INDEPENDENT_AMBULATORY_CARE_PROVIDER_SITE_OTHER): Payer: BC Managed Care – PPO

## 2016-04-27 DIAGNOSIS — E538 Deficiency of other specified B group vitamins: Secondary | ICD-10-CM

## 2016-04-27 MED ORDER — CYANOCOBALAMIN 1000 MCG/ML IJ SOLN
1000.0000 ug | Freq: Once | INTRAMUSCULAR | Status: AC
  Administered 2016-04-27: 1000 ug via INTRAMUSCULAR

## 2016-05-15 ENCOUNTER — Encounter: Payer: BC Managed Care – PPO | Admitting: Internal Medicine

## 2016-05-28 ENCOUNTER — Ambulatory Visit (INDEPENDENT_AMBULATORY_CARE_PROVIDER_SITE_OTHER): Payer: BC Managed Care – PPO | Admitting: General Practice

## 2016-05-28 DIAGNOSIS — E538 Deficiency of other specified B group vitamins: Secondary | ICD-10-CM

## 2016-05-28 MED ORDER — CYANOCOBALAMIN 1000 MCG/ML IJ SOLN
1000.0000 ug | Freq: Once | INTRAMUSCULAR | Status: AC
  Administered 2016-05-28: 1000 ug via INTRAMUSCULAR

## 2016-06-30 ENCOUNTER — Ambulatory Visit: Payer: BC Managed Care – PPO

## 2016-06-30 ENCOUNTER — Encounter: Payer: BC Managed Care – PPO | Admitting: Internal Medicine

## 2016-06-30 ENCOUNTER — Other Ambulatory Visit: Payer: Self-pay | Admitting: Internal Medicine

## 2016-07-08 ENCOUNTER — Ambulatory Visit: Payer: BC Managed Care – PPO

## 2016-07-21 ENCOUNTER — Ambulatory Visit (INDEPENDENT_AMBULATORY_CARE_PROVIDER_SITE_OTHER): Payer: BC Managed Care – PPO | Admitting: General Practice

## 2016-07-21 DIAGNOSIS — E538 Deficiency of other specified B group vitamins: Secondary | ICD-10-CM

## 2016-07-21 MED ORDER — CYANOCOBALAMIN 1000 MCG/ML IJ SOLN
1000.0000 ug | Freq: Once | INTRAMUSCULAR | Status: AC
  Administered 2016-07-21: 1000 ug via INTRAMUSCULAR

## 2016-07-24 ENCOUNTER — Ambulatory Visit (INDEPENDENT_AMBULATORY_CARE_PROVIDER_SITE_OTHER): Payer: BC Managed Care – PPO | Admitting: Internal Medicine

## 2016-07-24 ENCOUNTER — Encounter: Payer: Self-pay | Admitting: Internal Medicine

## 2016-07-24 VITALS — BP 130/70 | HR 82 | Wt 209.0 lb

## 2016-07-24 DIAGNOSIS — Z1211 Encounter for screening for malignant neoplasm of colon: Secondary | ICD-10-CM

## 2016-07-24 DIAGNOSIS — G5712 Meralgia paresthetica, left lower limb: Secondary | ICD-10-CM

## 2016-07-24 MED ORDER — ERGOCALCIFEROL 1.25 MG (50000 UT) PO CAPS: 50000.0000 [IU] | ORAL_CAPSULE | ORAL | 3 refills | Status: DC

## 2016-07-24 NOTE — Progress Notes (Signed)
Pre visit review using our clinic review tool, if applicable. No additional management support is needed unless otherwise documented below in the visit note. 

## 2016-07-24 NOTE — Assessment & Plan Note (Signed)
B complex  Loose belt

## 2016-07-24 NOTE — Progress Notes (Signed)
Subjective:  Patient ID: William Acosta, male    DOB: 04/06/1956  Age: 61 y.o. MRN: [id number]  CC: Leg Pain (Intermittnent Left thigh described as a burning, shooting pain x 2 weeks)   HPI William Acosta presents for L thigh occ pain off and on  - more at the desk Sister was diagnosed w/a pre-Ca polyp    Outpatient Medications Prior to Visit  Medication Sig Dispense Refill  . ergocalciferol (VITAMIN D2) 50000 units capsule Take 1 capsule (50,000 Units total) by mouth every 30 (thirty) days. 12 capsule 3  . losartan (COZAAR) 100 MG tablet TAKE 1 TABLET BY MOUTH EVERY DAY 90 tablet 1   No facility-administered medications prior to visit.     ROS Review of Systems  Constitutional: Negative for appetite change, fatigue and unexpected weight change.  HENT: Negative for congestion, nosebleeds, sneezing, sore throat and trouble swallowing.   Eyes: Negative for itching and visual disturbance.  Respiratory: Negative for cough.   Cardiovascular: Negative for chest pain, palpitations and leg swelling.  Gastrointestinal: Negative for abdominal distention, blood in stool, diarrhea and nausea.  Genitourinary: Negative for frequency and hematuria.  Musculoskeletal: Negative for back pain, gait problem, joint swelling and neck pain.  Skin: Negative for rash.  Neurological: Positive for numbness. Negative for dizziness, tremors, speech difficulty and weakness.  Psychiatric/Behavioral: Negative for agitation, dysphoric mood, sleep disturbance and suicidal ideas. The patient is not nervous/anxious.     Objective:  BP 130/70   Pulse 82   Wt 209 lb (94.8 kg)   SpO2 98%   BMI 29.99 kg/m   BP Readings from Last 3 Encounters:  07/24/16 130/70  03/26/16 (!) 154/84  03/19/16 122/80    Wt Readings from Last 3 Encounters:  07/24/16 209 lb (94.8 kg)  03/26/16 210 lb 12.8 oz (95.6 kg)  03/19/16 207 lb 6.4 oz (94.1 kg)    Physical Exam  Constitutional: He is oriented to person, place,  and time. He appears well-developed. No distress.  NAD  HENT:  Mouth/Throat: Oropharynx is clear and moist.  Eyes: Conjunctivae are normal. Pupils are equal, round, and reactive to light.  Neck: Normal range of motion. No JVD present. No thyromegaly present.  Cardiovascular: Normal rate, regular rhythm, normal heart sounds and intact distal pulses.  Exam reveals no gallop and no friction rub.   No murmur heard. Pulmonary/Chest: Effort normal and breath sounds normal. No respiratory distress. He has no wheezes. He has no rales. He exhibits no tenderness.  Abdominal: Soft. Bowel sounds are normal. He exhibits no distension and no mass. There is no tenderness. There is no rebound and no guarding.  Musculoskeletal: Normal range of motion. He exhibits tenderness. He exhibits no edema.  Lymphadenopathy:    He has no cervical adenopathy.  Neurological: He is alert and oriented to person, place, and time. He has normal reflexes. No cranial nerve deficit. He exhibits normal muscle tone. He displays a negative Romberg sign. Coordination and gait normal.  Skin: Skin is warm and dry. No rash noted.  Psychiatric: He has a normal mood and affect. His behavior is normal. Judgment and thought content normal.  tight belt 2x3 cm anter L thigh area of decr sensation     Time spent with the patient: 20 min FTF of which >50% was spent on counseling re: .... management > 50% spent counseling patient with regards to the meralgia problems above, colonoscopy coordination of care    Lab Results  Component Value Date  WBC 9.2 03/19/2016   HGB 16.7 03/19/2016   HCT 48.5 03/19/2016   PLT 265.0 03/19/2016   GLUCOSE 94 03/19/2016   CHOL 183 01/14/2015   TRIG 159.0 (H) 01/14/2015   HDL 42.00 01/14/2015   LDLCALC 109 (H) 01/14/2015   ALT 17 03/19/2016   AST 15 03/19/2016   NA 140 03/19/2016   K 4.6 03/19/2016   CL 104 03/19/2016   CREATININE 1.02 03/19/2016   BUN 11 03/19/2016   CO2 31 03/19/2016   TSH  0.88 01/14/2015   PSA 2.16 01/14/2015   HGBA1C 5.5 03/19/2016    No results found.  Assessment & Plan:   There are no diagnoses linked to this encounter. I am having William Acosta maintain his ergocalciferol, losartan, and cyanocobalamin.  Meds ordered this encounter  Medications  . cyanocobalamin (,VITAMIN B-12,) 1000 MCG/ML injection    Sig: Inject 1,000 mcg into the muscle every 30 (thirty) days.     Follow-up: No Follow-up on file.  Walker Kehr, MD

## 2016-07-31 ENCOUNTER — Encounter: Payer: BC Managed Care – PPO | Admitting: Internal Medicine

## 2016-08-06 ENCOUNTER — Encounter: Payer: Self-pay | Admitting: Internal Medicine

## 2016-08-18 ENCOUNTER — Ambulatory Visit (INDEPENDENT_AMBULATORY_CARE_PROVIDER_SITE_OTHER): Payer: BC Managed Care – PPO | Admitting: Emergency Medicine

## 2016-08-18 DIAGNOSIS — E538 Deficiency of other specified B group vitamins: Secondary | ICD-10-CM | POA: Diagnosis not present

## 2016-08-18 MED ORDER — CYANOCOBALAMIN 1000 MCG/ML IJ SOLN
1000.0000 ug | Freq: Once | INTRAMUSCULAR | Status: AC
  Administered 2016-08-18: 1000 ug via INTRAMUSCULAR

## 2016-08-26 ENCOUNTER — Telehealth: Payer: Self-pay | Admitting: Internal Medicine

## 2016-08-26 DIAGNOSIS — Z0000 Encounter for general adult medical examination without abnormal findings: Secondary | ICD-10-CM

## 2016-08-26 NOTE — Telephone Encounter (Signed)
Patient has moved CPE to 4/20.  He is requesting all labs to be entered to have done a week before appt.

## 2016-08-27 ENCOUNTER — Encounter: Payer: BC Managed Care – PPO | Admitting: Internal Medicine

## 2016-08-27 NOTE — Telephone Encounter (Signed)
Cbc cmet psa tsh Lipid ua with reflex   Above okayed per verbal from PCP.

## 2016-09-01 NOTE — Telephone Encounter (Signed)
Entered labs and sent pt my chart message.

## 2016-09-15 ENCOUNTER — Ambulatory Visit (INDEPENDENT_AMBULATORY_CARE_PROVIDER_SITE_OTHER): Payer: BC Managed Care – PPO | Admitting: General Practice

## 2016-09-15 DIAGNOSIS — E538 Deficiency of other specified B group vitamins: Secondary | ICD-10-CM

## 2016-09-15 MED ORDER — CYANOCOBALAMIN 1000 MCG/ML IJ SOLN
1000.0000 ug | Freq: Once | INTRAMUSCULAR | Status: AC
Start: 2016-09-15 — End: 2016-09-15
  Administered 2016-09-15: 1000 ug via INTRAMUSCULAR

## 2016-09-22 ENCOUNTER — Telehealth: Payer: Self-pay

## 2016-09-22 ENCOUNTER — Ambulatory Visit: Payer: BC Managed Care – PPO

## 2016-09-22 VITALS — Ht 69.0 in | Wt 210.2 lb

## 2016-09-22 DIAGNOSIS — Z1211 Encounter for screening for malignant neoplasm of colon: Secondary | ICD-10-CM

## 2016-09-22 MED ORDER — SUPREP BOWEL PREP KIT 17.5-3.13-1.6 GM/177ML PO SOLN: 1.0000 | Freq: Once | ORAL | 0 refills | Status: AC

## 2016-09-22 NOTE — Telephone Encounter (Signed)
No. Needs office visit. Hasn't been seen in many years. Thanks

## 2016-09-22 NOTE — Telephone Encounter (Signed)
Dr Henrene Pastor,    Pt wants EGD added on for Gerd and dysphagia with solids. Thank you, Angela/PV

## 2016-09-22 NOTE — Progress Notes (Signed)
No diet meds No home oxygen No past problems with anesthesia No allergies to eggs or soy  Declined emmi 

## 2016-09-23 ENCOUNTER — Encounter: Payer: Self-pay | Admitting: Internal Medicine

## 2016-09-23 NOTE — Telephone Encounter (Signed)
LMOM explaining that pt needs an OV to determine whether or not he needs an EGD. Angela/PV

## 2016-09-28 NOTE — Telephone Encounter (Signed)
Spoke with pt via phone.  Made appt with Amy, PA to evaluate need for EGD.  Appt was after colonoscopy so cancelled colon for now. Angela/PV

## 2016-10-06 ENCOUNTER — Encounter: Payer: BC Managed Care – PPO | Admitting: Internal Medicine

## 2016-10-12 ENCOUNTER — Ambulatory Visit: Payer: BC Managed Care – PPO | Admitting: Physician Assistant

## 2016-10-12 ENCOUNTER — Encounter: Payer: BC Managed Care – PPO | Admitting: Internal Medicine

## 2016-10-16 ENCOUNTER — Encounter: Payer: BC Managed Care – PPO | Admitting: Internal Medicine

## 2016-10-27 ENCOUNTER — Ambulatory Visit (INDEPENDENT_AMBULATORY_CARE_PROVIDER_SITE_OTHER): Payer: BC Managed Care – PPO | Admitting: Family

## 2016-10-27 ENCOUNTER — Encounter: Payer: Self-pay | Admitting: Family

## 2016-10-27 VITALS — BP 124/82 | HR 72 | Temp 97.8°F | Resp 16 | Ht 69.0 in | Wt 210.0 lb

## 2016-10-27 DIAGNOSIS — J0100 Acute maxillary sinusitis, unspecified: Secondary | ICD-10-CM | POA: Diagnosis not present

## 2016-10-27 DIAGNOSIS — R21 Rash and other nonspecific skin eruption: Secondary | ICD-10-CM | POA: Diagnosis not present

## 2016-10-27 DIAGNOSIS — E538 Deficiency of other specified B group vitamins: Secondary | ICD-10-CM | POA: Diagnosis not present

## 2016-10-27 MED ORDER — AMOXICILLIN-POT CLAVULANATE 200-28.5 MG/5ML PO SUSR: ORAL | 0 refills | Status: DC

## 2016-10-27 MED ORDER — CYANOCOBALAMIN 1000 MCG/ML IJ SOLN
1000.0000 ug | Freq: Once | INTRAMUSCULAR | Status: AC
  Administered 2016-10-27: 1000 ug via INTRAMUSCULAR

## 2016-10-27 MED ORDER — TRIAMCINOLONE ACETONIDE 0.025 % EX CREA: 1.0000 "application " | TOPICAL_CREAM | Freq: Two times a day (BID) | CUTANEOUS | 0 refills | Status: DC

## 2016-10-27 MED ORDER — PROMETHAZINE-CODEINE 6.25-10 MG/5ML PO SYRP
5.0000 mL | ORAL_SOLUTION | Freq: Four times a day (QID) | ORAL | 0 refills | Status: DC | PRN
Start: 2016-10-27 — End: 2016-11-06

## 2016-10-27 MED ORDER — AMOXICILLIN-POT CLAVULANATE 875-125 MG PO TABS: 1.0000 | ORAL_TABLET | Freq: Two times a day (BID) | ORAL | 0 refills | Status: DC

## 2016-10-27 NOTE — Assessment & Plan Note (Signed)
Symptoms and exam consistent with acute sinusitis most likely bacterial. Start Augmentin. Start promethazine-codeine as needed for cough and sleep. Continue over-the-counter medications as needed for symptom relief and supportive care. Follow-up if symptoms worsen or do not improve.

## 2016-10-27 NOTE — Progress Notes (Signed)
Subjective:    Patient ID: William Acosta, male    DOB: February 09, 1956, 61 y.o.   MRN: [id number]  Chief Complaint  Patient presents with  . Nasal Congestion    starting friday has had sinus congestion, coughing up mucus, has had bloody nose, sxs get worse at night    HPI:  William Acosta is a 61 y.o. male who  has a past medical history of Allergic rhinitis; Allergy; Arthritis; Cancer ([place]); Depression; GERD (gastroesophageal reflux disease); Hyperlipidemia; Hypertension; and Multinodular goiter. and presents today for an acute office visit.  1.) Sinus symptoms - This is a new problem. Associated symptoms of congestion, productive cough and sinus pressure have been going on for several days. No fevers. Timing of the symptoms is worse at night. There is also dizziness when turning his head. Denies any modifying factors or attempted treatments. Course of the symptoms is improving slightly. No recent antibiotics. There have been other people around him with similar symptoms.   2.) Rash - This is a new problem. Associated symptoms of a rash located on his buttocks has been going on for several weeks. Describes "pimple-like" areas at time that come and go. No pain with occasional itchiness. Denies any modifying factors or attempted treatments.  Allergies  Allergen Reactions  . Moxifloxacin     REACTION: felt bad      Outpatient Medications Prior to Visit  Medication Sig Dispense Refill  . cyanocobalamin (,VITAMIN B-12,) 1000 MCG/ML injection Inject 1,000 mcg into the muscle every 30 (thirty) days.    . ergocalciferol (VITAMIN D2) 50000 units capsule Take 1 capsule (50,000 Units total) by mouth every 30 (thirty) days. 12 capsule 3  . losartan (COZAAR) 100 MG tablet TAKE 1 TABLET BY MOUTH EVERY DAY 90 tablet 1   No facility-administered medications prior to visit.       Past Surgical History:  Procedure Laterality Date  . SALIVARY GLAND SURGERY     Right      Past Medical  History:  Diagnosis Date  . Allergic rhinitis   . Allergy   . Arthritis   . Cancer ([place])    skin melanoma on right leg 2010  . Depression   . GERD (gastroesophageal reflux disease)   . Hyperlipidemia   . Hypertension   . Multinodular goiter       Review of Systems  Constitutional: Negative for chills and fever.  HENT: Positive for congestion, sinus pain and sinus pressure. Negative for ear pain.   Respiratory: Positive for cough. Negative for chest tightness and shortness of breath.   Skin: Positive for rash.  Neurological: Positive for headaches.      Objective:    BP 124/82 (BP Location: Left Arm, Patient Position: Sitting, Cuff Size: Large)   Pulse 72   Temp 97.8 F (36.6 C) (Oral)   Resp 16   Ht 5\' 9"  (1.753 m)   Wt 210 lb (95.3 kg)   SpO2 98%   BMI 31.01 kg/m  Nursing note and vital signs reviewed.  Physical Exam  Constitutional: He is oriented to person, place, and time. He appears well-developed and well-nourished.  HENT:  Right Ear: Hearing, tympanic membrane, external ear and ear canal normal.  Left Ear: Hearing, tympanic membrane, external ear and ear canal normal.  Nose: Right sinus exhibits maxillary sinus tenderness. Right sinus exhibits no frontal sinus tenderness. Left sinus exhibits maxillary sinus tenderness. Left sinus exhibits no frontal sinus tenderness.  Mouth/Throat: Uvula is midline, oropharynx is  clear and moist and mucous membranes are normal.  Neck: Neck supple.  Cardiovascular: Normal rate, regular rhythm, normal heart sounds and intact distal pulses.   Pulmonary/Chest: Effort normal and breath sounds normal. No respiratory distress. He has no wheezes. He has no rales. He exhibits no tenderness.  Neurological: He is alert and oriented to person, place, and time.  Skin: Skin is warm and dry.  Light brown plaque located around left and right buttocks with dry/scaly appearance. There are define borders. No evidence of infection or papules.         Assessment & Plan:   Problem List Items Addressed This Visit      Respiratory   Acute sinusitis    Symptoms and exam consistent with acute sinusitis most likely bacterial. Start Augmentin. Start promethazine-codeine as needed for cough and sleep. Continue over-the-counter medications as needed for symptom relief and supportive care. Follow-up if symptoms worsen or do not improve.      Relevant Medications   amoxicillin-clavulanate (AUGMENTIN) 875-125 MG tablet   promethazine-codeine (PHENERGAN WITH CODEINE) 6.25-10 MG/5ML syrup     Musculoskeletal and Integument   Rash    Rash appears consistent with inflammation and possible bacterial infection. Start Triamcinolone and recommend Hibiclens. Follow up with dermatology if symptoms do not improve.         Other   B12 deficiency - Primary   Relevant Medications   cyanocobalamin ((VITAMIN B-12)) injection 1,000 mcg (Completed)       I have discontinued [name]'s amoxicillin-clavulanate. I am also having him start on amoxicillin-clavulanate, triamcinolone, and promethazine-codeine. Additionally, I am having him maintain his losartan, ergocalciferol, and cyanocobalamin. We administered cyanocobalamin.   Meds ordered this encounter  Medications  . cyanocobalamin ((VITAMIN B-12)) injection 1,000 mcg  . DISCONTD: amoxicillin-clavulanate (AUGMENTIN) 200-28.5 MG/5ML suspension    Sig: Take 20 ml by mouth twice daily for 7 days    Dispense:  300 mL    Refill:  0    Order Specific Question:   Supervising Provider    Answer:   Pricilla Holm A [[id number]]  . amoxicillin-clavulanate (AUGMENTIN) 875-125 MG tablet    Sig: Take 1 tablet by mouth 2 (two) times daily.    Dispense:  14 tablet    Refill:  0    Order Specific Question:   Supervising Provider    Answer:   Pricilla Holm A [[id number]]  . triamcinolone (KENALOG) 0.025 % cream    Sig: Apply 1 application topically 2 (two) times daily.    Dispense:  30 g    Refill:  0     Order Specific Question:   Supervising Provider    Answer:   Pricilla Holm A [[id number]]  . promethazine-codeine (PHENERGAN WITH CODEINE) 6.25-10 MG/5ML syrup    Sig: Take 5 mLs by mouth every 6 (six) hours as needed.    Dispense:  180 mL    Refill:  0    Order Specific Question:   Supervising Provider    Answer:   Pricilla Holm A [[id number]]     Follow-up: Return if symptoms worsen or fail to improve.  Mauricio Po, FNP

## 2016-10-27 NOTE — Assessment & Plan Note (Signed)
Rash appears consistent with inflammation and possible bacterial infection. Start Triamcinolone and recommend Hibiclens. Follow up with dermatology if symptoms do not improve.

## 2016-10-27 NOTE — Patient Instructions (Addendum)
Thank you for choosing [facility].  SUMMARY AND INSTRUCTIONS:  Try Hibiclens.  Steroid cream as needed.  Follow up for worsening of symptoms.   Medication:  Your prescription(s) have been submitted to your pharmacy or been printed and provided for you. Please take as directed and contact our office if you believe you are having problem(s) with the medication(s) or have any questions.  Follow up:  If your symptoms worsen or fail to improve, please contact our office for further instruction, or in case of emergency go directly to the emergency room at the closest medical facility.    General Recommendations:    Please drink plenty of fluids.  Get plenty of rest   Sleep in humidified air  Use saline nasal sprays  Netti pot   OTC Medications:  Decongestants - helps relieve congestion   Flonase (generic fluticasone) or Nasacort (generic triamcinolone) - please make sure to use the "cross-over" technique at a 45 degree angle towards the opposite eye as opposed to straight up the nasal passageway.   Sudafed (generic pseudoephedrine - Note this is the one that is available behind the pharmacy counter); Products with phenylephrine (-PE) may also be used but is often not as effective as pseudoephedrine.   If you have HIGH BLOOD PRESSURE - Coricidin HBP; AVOID any product that is -D as this contains pseudoephedrine which may increase your blood pressure.  Afrin (oxymetazoline) every 6-8 hours for up to 3 days.   Allergies - helps relieve runny nose, itchy eyes and sneezing   Claritin (generic loratidine), Allegra (fexofenidine), or Zyrtec (generic cyrterizine) for runny nose. These medications should not cause drowsiness.  Note - Benadryl (generic diphenhydramine) may be used however may cause drowsiness  Cough -   Delsym or Robitussin (generic dextromethorphan)  Expectorants - helps loosen mucus to ease removal   Mucinex (generic guaifenesin) as directed on  the package.  Headaches / General Aches   Tylenol (generic acetaminophen) - DO NOT EXCEED 3 grams (3,000 mg) in a 24 hour time period  Advil/Motrin (generic ibuprofen)   Sore Throat -   Salt water gargle   Chloraseptic (generic benzocaine) spray or lozenges / Sucrets (generic dyclonine)    Sinusitis Sinusitis is redness, soreness, and inflammation of the paranasal sinuses. Paranasal sinuses are air pockets within the bones of your face (beneath the eyes, the middle of the forehead, or above the eyes). In healthy paranasal sinuses, mucus is able to drain out, and air is able to circulate through them by way of your nose. However, when your paranasal sinuses are inflamed, mucus and air can become trapped. This can allow bacteria and other germs to grow and cause infection. Sinusitis can develop quickly and last only a short time (acute) or continue over a long period (chronic). Sinusitis that lasts for more than 12 weeks is considered chronic.  CAUSES  Causes of sinusitis include:  Allergies.  Structural abnormalities, such as displacement of the cartilage that separates your nostrils (deviated septum), which can decrease the air flow through your nose and sinuses and affect sinus drainage.  Functional abnormalities, such as when the small hairs (cilia) that line your sinuses and help remove mucus do not work properly or are not present. SIGNS AND SYMPTOMS  Symptoms of acute and chronic sinusitis are the same. The primary symptoms are pain and pressure around the affected sinuses. Other symptoms include:  Upper toothache.  Earache.  Headache.  Bad breath.  Decreased sense of smell and taste.  A  cough, which worsens when you are lying flat.  Fatigue.  Fever.  Thick drainage from your nose, which often is green and may contain pus (purulent).  Swelling and warmth over the affected sinuses. DIAGNOSIS  Your health care provider will perform a physical exam. During the  exam, your health care provider may:  Look in your nose for signs of abnormal growths in your nostrils (nasal polyps).  Tap over the affected sinus to check for signs of infection.  View the inside of your sinuses (endoscopy) using an imaging device that has a light attached (endoscope). If your health care provider suspects that you have chronic sinusitis, one or more of the following tests may be recommended:  Allergy tests.  Nasal culture. A sample of mucus is taken from your nose, sent to a lab, and screened for bacteria.  Nasal cytology. A sample of mucus is taken from your nose and examined by your health care provider to determine if your sinusitis is related to an allergy. TREATMENT  Most cases of acute sinusitis are related to a viral infection and will resolve on their own within 10 days. Sometimes medicines are prescribed to help relieve symptoms (pain medicine, decongestants, nasal steroid sprays, or saline sprays).  However, for sinusitis related to a bacterial infection, your health care provider will prescribe antibiotic medicines. These are medicines that will help kill the bacteria causing the infection.  Rarely, sinusitis is caused by a fungal infection. In theses cases, your health care provider will prescribe antifungal medicine. For some cases of chronic sinusitis, surgery is needed. Generally, these are cases in which sinusitis recurs more than 3 times per year, despite other treatments. HOME CARE INSTRUCTIONS   Drink plenty of water. Water helps thin the mucus so your sinuses can drain more easily.  Use a humidifier.  Inhale steam 3 to 4 times a day (for example, sit in the bathroom with the shower running).  Apply a warm, moist washcloth to your face 3 to 4 times a day, or as directed by your health care provider.  Use saline nasal sprays to help moisten and clean your sinuses.  Take medicines only as directed by your health care provider.  If you were  prescribed either an antibiotic or antifungal medicine, finish it all even if you start to feel better. SEEK IMMEDIATE MEDICAL CARE IF:  You have increasing pain or severe headaches.  You have nausea, vomiting, or drowsiness.  You have swelling around your face.  You have vision problems.  You have a stiff neck.  You have difficulty breathing. MAKE SURE YOU:   Understand these instructions.  Will watch your condition.  Will get help right away if you are not doing well or get worse. Document Released: 06/15/2005 Document Revised: 10/30/2013 Document Reviewed: 06/30/2011 [facility] Patient Information 2015 [place], [place]. This information is not intended to replace advice given to you by your health care provider. Make sure you discuss any questions you have with your health care provider.

## 2016-10-30 ENCOUNTER — Other Ambulatory Visit (INDEPENDENT_AMBULATORY_CARE_PROVIDER_SITE_OTHER): Payer: BC Managed Care – PPO

## 2016-10-30 DIAGNOSIS — Z0000 Encounter for general adult medical examination without abnormal findings: Secondary | ICD-10-CM

## 2016-10-30 LAB — LIPID PANEL
CHOLESTEROL: 173 mg/dL (ref 0–200)
HDL: 43.5 mg/dL (ref >39.00)
LDL Cholesterol: 109 mg/dL — ABNORMAL HIGH (ref 0–99)
NonHDL: 129.3
Total CHOL/HDL Ratio: 4
Triglycerides: 103 mg/dL (ref 0.0–149.0)
VLDL: 20.6 mg/dL (ref 0.0–40.0)

## 2016-10-30 LAB — URINALYSIS, ROUTINE W REFLEX MICROSCOPIC
Bilirubin Urine: NEGATIVE
Hgb urine dipstick: NEGATIVE
KETONES UR: NEGATIVE
Leukocytes, UA: NEGATIVE
NITRITE: NEGATIVE
SPECIFIC GRAVITY, URINE: 1.025 (ref 1.000–1.030)
TOTAL PROTEIN, URINE-UPE24: NEGATIVE
URINE GLUCOSE: NEGATIVE
UROBILINOGEN UA: 0.2 (ref 0.0–1.0)
pH: 6 (ref 5.0–8.0)

## 2016-10-30 LAB — COMPREHENSIVE METABOLIC PANEL
ALBUMIN: 4.1 g/dL (ref 3.5–5.2)
ALK PHOS: 116 U/L (ref 39–117)
ALT: 17 U/L (ref 0–53)
AST: 17 U/L (ref 0–37)
BILIRUBIN TOTAL: 0.5 mg/dL (ref 0.2–1.2)
BUN: 12 mg/dL (ref 6–23)
CALCIUM: 9.3 mg/dL (ref 8.4–10.5)
CHLORIDE: 105 meq/L (ref 96–112)
CO2: 28 mEq/L (ref 19–32)
CREATININE: 1.01 mg/dL (ref 0.40–1.50)
GFR: 79.31 mL/min (ref >60.00)
Glucose, Bld: 90 mg/dL (ref 70–99)
Potassium: 4.3 mEq/L (ref 3.5–5.1)
SODIUM: 142 meq/L (ref 135–145)
TOTAL PROTEIN: 6.6 g/dL (ref 6.0–8.3)

## 2016-10-30 LAB — CBC WITH DIFFERENTIAL/PLATELET
BASOS PCT: 0.6 % (ref 0.0–3.0)
Basophils Absolute: 0.1 10*3/uL (ref 0.0–0.1)
EOS ABS: 0.1 10*3/uL (ref 0.0–0.7)
EOS PCT: 1 % (ref 0.0–5.0)
HEMATOCRIT: 46.8 % (ref 39.0–52.0)
Hemoglobin: 15.9 g/dL (ref 13.0–17.0)
LYMPHS PCT: 30 % (ref 12.0–46.0)
Lymphs Abs: 2.9 10*3/uL (ref 0.7–4.0)
MCHC: 34 g/dL (ref 30.0–36.0)
MCV: 90 fl (ref 78.0–100.0)
Monocytes Absolute: 0.7 10*3/uL (ref 0.1–1.0)
Monocytes Relative: 7.4 % (ref 3.0–12.0)
NEUTROS ABS: 5.9 10*3/uL (ref 1.4–7.7)
Neutrophils Relative %: 61 % (ref 43.0–77.0)
Platelets: 280 10*3/uL (ref 150.0–400.0)
RBC: 5.19 Mil/uL (ref 4.22–5.81)
RDW: 12.8 % (ref 11.5–15.5)
WBC: 9.7 10*3/uL (ref 4.0–10.5)

## 2016-10-30 LAB — PSA: PSA: 2.65 ng/mL (ref 0.10–4.00)

## 2016-10-30 LAB — TSH: TSH: 0.52 u[IU]/mL (ref 0.35–4.50)

## 2016-11-06 ENCOUNTER — Encounter: Payer: Self-pay | Admitting: Internal Medicine

## 2016-11-06 ENCOUNTER — Ambulatory Visit (INDEPENDENT_AMBULATORY_CARE_PROVIDER_SITE_OTHER): Payer: BC Managed Care – PPO | Admitting: Internal Medicine

## 2016-11-06 VITALS — BP 130/84 | HR 67 | Temp 97.6°F | Ht 69.0 in | Wt 213.1 lb

## 2016-11-06 DIAGNOSIS — M6530 Trigger finger, unspecified finger: Secondary | ICD-10-CM | POA: Insufficient documentation

## 2016-11-06 DIAGNOSIS — M65332 Trigger finger, left middle finger: Secondary | ICD-10-CM

## 2016-11-06 DIAGNOSIS — Z0000 Encounter for general adult medical examination without abnormal findings: Secondary | ICD-10-CM | POA: Diagnosis not present

## 2016-11-06 DIAGNOSIS — I10 Essential (primary) hypertension: Secondary | ICD-10-CM

## 2016-11-06 DIAGNOSIS — E538 Deficiency of other specified B group vitamins: Secondary | ICD-10-CM

## 2016-11-06 NOTE — Assessment & Plan Note (Signed)
On B12 

## 2016-11-06 NOTE — Progress Notes (Signed)
Subjective:  Patient ID: William Acosta, male    DOB: October 02, 1955  Age: 61 y.o. MRN: [id number]  CC: No chief complaint on file.   HPI William Acosta presents for a well exam C/o L middle finger hurts and catches   Outpatient Medications Prior to Visit  Medication Sig Dispense Refill  . cyanocobalamin (,VITAMIN B-12,) 1000 MCG/ML injection Inject 1,000 mcg into the muscle every 30 (thirty) days.    . ergocalciferol (VITAMIN D2) 50000 units capsule Take 1 capsule (50,000 Units total) by mouth every 30 (thirty) days. 12 capsule 3  . losartan (COZAAR) 100 MG tablet TAKE 1 TABLET BY MOUTH EVERY DAY 90 tablet 1  . triamcinolone (KENALOG) 0.025 % cream Apply 1 application topically 2 (two) times daily. 30 g 0  . amoxicillin-clavulanate (AUGMENTIN) 875-125 MG tablet Take 1 tablet by mouth 2 (two) times daily. 14 tablet 0  . promethazine-codeine (PHENERGAN WITH CODEINE) 6.25-10 MG/5ML syrup Take 5 mLs by mouth every 6 (six) hours as needed. 180 mL 0   No facility-administered medications prior to visit.     ROS Review of Systems  Constitutional: Negative for appetite change, fatigue and unexpected weight change.  HENT: Negative for congestion, nosebleeds, sneezing, sore throat and trouble swallowing.   Eyes: Negative for itching and visual disturbance.  Respiratory: Negative for cough.   Cardiovascular: Negative for chest pain, palpitations and leg swelling.  Gastrointestinal: Negative for abdominal distention, blood in stool, diarrhea and nausea.  Genitourinary: Negative for frequency and hematuria.  Musculoskeletal: Positive for arthralgias. Negative for back pain, gait problem, joint swelling and neck pain.  Skin: Negative for rash.  Neurological: Negative for dizziness, tremors, speech difficulty and weakness.  Psychiatric/Behavioral: Negative for agitation, dysphoric mood and sleep disturbance. The patient is not nervous/anxious.     Objective:  BP 130/84 (BP Location: Left Arm,  Patient Position: Sitting, Cuff Size: Normal)   Pulse 67   Temp 97.6 F (36.4 C) (Oral)   Ht 5\' 9"  (1.753 m)   Wt 213 lb 1.3 oz (96.7 kg)   SpO2 99%   BMI 31.47 kg/m   BP Readings from Last 3 Encounters:  11/06/16 130/84  10/27/16 124/82  07/24/16 130/70    Wt Readings from Last 3 Encounters:  11/06/16 213 lb 1.3 oz (96.7 kg)  10/27/16 210 lb (95.3 kg)  09/22/16 210 lb 3.2 oz (95.3 kg)    Physical Exam  Constitutional: He is oriented to person, place, and time. He appears well-developed and well-nourished. No distress.  HENT:  Head: Normocephalic and atraumatic.  Right Ear: External ear normal.  Left Ear: External ear normal.  Nose: Nose normal.  Mouth/Throat: Oropharynx is clear and moist. No oropharyngeal exudate.  Eyes: Conjunctivae and EOM are normal. Pupils are equal, round, and reactive to light. Right eye exhibits no discharge. Left eye exhibits no discharge. No scleral icterus.  Neck: Normal range of motion. Neck supple. No JVD present. No tracheal deviation present. No thyromegaly present.  Cardiovascular: Normal rate, regular rhythm, normal heart sounds and intact distal pulses.  Exam reveals no gallop and no friction rub.   No murmur heard. Pulmonary/Chest: Effort normal and breath sounds normal. No stridor. No respiratory distress. He has no wheezes. He has no rales. He exhibits no tenderness.  Abdominal: Soft. Bowel sounds are normal. He exhibits no distension and no mass. There is no tenderness. There is no rebound and no guarding.  Genitourinary: Rectum normal, prostate normal and penis normal. Rectal exam shows guaiac  negative stool. No penile tenderness.  Musculoskeletal: Normal range of motion. He exhibits no edema or tenderness.  Lymphadenopathy:    He has no cervical adenopathy.  Neurological: He is alert and oriented to person, place, and time. He has normal reflexes. No cranial nerve deficit. He exhibits normal muscle tone. Coordination normal.  Skin:  Skin is warm and dry. No rash noted. He is not diaphoretic. No erythema. No pallor.  Psychiatric: He has a normal mood and affect. His behavior is normal. Judgment and thought content normal.  L middle tridder finger  Procedure: EKG Indication: well exam Impression: NSR. IRBBB. No new changes.   Lab Results  Component Value Date   WBC 9.7 10/30/2016   HGB 15.9 10/30/2016   HCT 46.8 10/30/2016   PLT 280.0 10/30/2016   GLUCOSE 90 10/30/2016   CHOL 173 10/30/2016   TRIG 103.0 10/30/2016   HDL 43.50 10/30/2016   LDLCALC 109 (H) 10/30/2016   ALT 17 10/30/2016   AST 17 10/30/2016   NA 142 10/30/2016   K 4.3 10/30/2016   CL 105 10/30/2016   CREATININE 1.01 10/30/2016   BUN 12 10/30/2016   CO2 28 10/30/2016   TSH 0.52 10/30/2016   PSA 2.65 10/30/2016   HGBA1C 5.5 03/19/2016    No results found.  Assessment & Plan:   Diagnoses and all orders for this visit:  Well adult exam -     EKG 12-Lead   I have discontinued [name]'s amoxicillin-clavulanate and promethazine-codeine. I am also having him maintain his losartan, ergocalciferol, cyanocobalamin, and triamcinolone.  No orders of the defined types were placed in this encounter.    Follow-up: No Follow-up on file.  Walker Kehr, MD

## 2016-11-06 NOTE — Assessment & Plan Note (Signed)
Losartan 

## 2016-11-06 NOTE — Assessment & Plan Note (Signed)
We discussed age appropriate health related issues, including available/recomended screening tests and vaccinations. We discussed a need for adhering to healthy diet and exercise. Labs were ordered to be later reviewed . All questions were answered. EKG Shingrix

## 2016-11-06 NOTE — Assessment & Plan Note (Signed)
Options discussed 

## 2016-11-06 NOTE — Patient Instructions (Addendum)
Trigger finger splint Padded gloves Turmeric for pain Shingrix

## 2016-12-04 ENCOUNTER — Ambulatory Visit (INDEPENDENT_AMBULATORY_CARE_PROVIDER_SITE_OTHER): Payer: BC Managed Care – PPO

## 2016-12-04 DIAGNOSIS — E538 Deficiency of other specified B group vitamins: Secondary | ICD-10-CM

## 2016-12-04 MED ORDER — CYANOCOBALAMIN 1000 MCG/ML IJ SOLN
1000.0000 ug | Freq: Once | INTRAMUSCULAR | Status: AC
  Administered 2016-12-04: 1000 ug via INTRAMUSCULAR

## 2017-01-01 ENCOUNTER — Ambulatory Visit (INDEPENDENT_AMBULATORY_CARE_PROVIDER_SITE_OTHER): Payer: BC Managed Care – PPO

## 2017-01-01 DIAGNOSIS — E538 Deficiency of other specified B group vitamins: Secondary | ICD-10-CM | POA: Diagnosis not present

## 2017-01-01 MED ORDER — CYANOCOBALAMIN 1000 MCG/ML IJ SOLN
1000.0000 ug | Freq: Once | INTRAMUSCULAR | Status: AC
  Administered 2017-01-01: 1000 ug via INTRAMUSCULAR

## 2017-01-06 ENCOUNTER — Ambulatory Visit: Payer: BC Managed Care – PPO

## 2017-01-16 ENCOUNTER — Other Ambulatory Visit: Payer: Self-pay | Admitting: Internal Medicine

## 2017-01-19 ENCOUNTER — Other Ambulatory Visit: Payer: Self-pay | Admitting: Internal Medicine

## 2017-02-02 ENCOUNTER — Encounter: Payer: Self-pay | Admitting: Internal Medicine

## 2017-02-02 ENCOUNTER — Ambulatory Visit (INDEPENDENT_AMBULATORY_CARE_PROVIDER_SITE_OTHER): Payer: BC Managed Care – PPO | Admitting: Internal Medicine

## 2017-02-02 VITALS — BP 142/84 | HR 83 | Ht 69.0 in | Wt 216.0 lb

## 2017-02-02 DIAGNOSIS — I10 Essential (primary) hypertension: Secondary | ICD-10-CM

## 2017-02-02 DIAGNOSIS — I8002 Phlebitis and thrombophlebitis of superficial vessels of left lower extremity: Secondary | ICD-10-CM | POA: Diagnosis not present

## 2017-02-02 MED ORDER — ASPIRIN 81 MG PO TBEC: 81.0000 mg | DELAYED_RELEASE_TABLET | Freq: Every day | ORAL | 12 refills | Status: DC

## 2017-02-02 NOTE — Assessment & Plan Note (Signed)
Mild, for asa 81 mg qd,  to f/u any worsening symptoms or concerns

## 2017-02-02 NOTE — Patient Instructions (Signed)
Please start the Aspirin 81 mg - 1 per day (enteric coated only)  Please wear compression stockings while sitting at work  Please continue all other medications as before, and refills have been done if requested.  Please have the pharmacy call with any other refills you may need.  Please keep your appointments with your specialists as you may have planned

## 2017-02-02 NOTE — Assessment & Plan Note (Signed)
Mild elevated likely reactive, o/w stable overall by history and exam, recent data reviewed with pt, and pt to continue medical treatment as before,  to f/u any worsening symptoms or concerns BP Readings from Last 3 Encounters:  02/02/17 (!) 142/84  11/06/16 130/84  10/27/16 124/82

## 2017-02-02 NOTE — Progress Notes (Signed)
Subjective:    Patient ID: William Acosta, male    DOB: 1956-04-10, 61 y.o.   MRN: [id number]  HPI  Here to f/u with c/o 2 days onset mild sharp left medial mid calf pain area about 1.5 cm, mild red and now improved today, but tender and something feels different under the skin.  States sits at work in a chair that does "cut in" to the back of the legs, and has chronic no change trace swelling to the LE's.  No hx of dvt, no recent trauma and  Pt denies fever, wt loss, night sweats, loss of appetite, or other constitutional symptoms  No hx of phlebitis.  Pt denies chest pain, increased sob or doe, wheezing, orthopnea, PND, palpitations, dizziness or syncope.   Pt denies polydipsia, polyuria Past Medical History:  Diagnosis Date  . Allergic rhinitis   . Allergy   . Arthritis   . Cancer ([place])    skin melanoma on right leg 2010  . Depression   . GERD (gastroesophageal reflux disease)   . Hyperlipidemia   . Hypertension   . Multinodular goiter    Past Surgical History:  Procedure Laterality Date  . SALIVARY GLAND SURGERY     Right    reports that he has quit smoking. He has never used smokeless tobacco. He reports that he does not drink alcohol or use drugs. family history includes Diabetes in his other; Heart disease in his unknown relative; Hypertension in his mother. Allergies  Allergen Reactions  . Moxifloxacin     REACTION: felt bad   Current Outpatient Prescriptions on File Prior to Visit  Medication Sig Dispense Refill  . cyanocobalamin (,VITAMIN B-12,) 1000 MCG/ML injection Inject 1,000 mcg into the muscle every 30 (thirty) days.    . ergocalciferol (VITAMIN D2) 50000 units capsule Take 1 capsule (50,000 Units total) by mouth every 30 (thirty) days. 12 capsule 3  . losartan (COZAAR) 100 MG tablet TAKE 1 TABLET BY MOUTH EVERY DAY 90 tablet 2  . triamcinolone (KENALOG) 0.025 % cream Apply 1 application topically 2 (two) times daily. 30 g 0   No current facility-administered  medications on file prior to visit.    Review of Systems  Constitutional: Negative for other unusual diaphoresis or sweats HENT: Negative for ear discharge or swelling Eyes: Negative for other worsening visual disturbances Respiratory: Negative for stridor or other swelling  Gastrointestinal: Negative for worsening distension or other blood Genitourinary: Negative for retention or other urinary change Musculoskeletal: Negative for other MSK pain or swelling Skin: Negative for color change or other new lesions Neurological: Negative for worsening tremors and other numbness  Psychiatric/Behavioral: Negative for worsening agitation or other fatigue All other system neg per pt    Objective:   Physical Exam BP (!) 142/84   Pulse 83   Ht 5\' 9"  (1.753 m)   Wt 216 lb (98 kg)   SpO2 99%   BMI 31.90 kg/m  VS noted,  Constitutional: Pt appears in NAD HENT: Head: NCAT.  Right Ear: External ear normal.  Left Ear: External ear normal.  Eyes: . Pupils are equal, round, and reactive to light. Conjunctivae and EOM are normal Nose: without d/c or deformity Neck: Neck supple. Gross normal ROM Cardiovascular: Normal rate and regular rhythm.   Pulmonary/Chest: Effort normal and breath sounds without rales or wheezing.  Abd:  Soft, NT, ND, + BS, no organomegaly Neurological: Pt is alert. At baseline orientation, motor grossly intact Skin: Skin is warm. No  rashes, trace bilat LE edema to knees left slightly worse than right, as well as 1.5 cm area left medial mid calf mild tender and erythema with probable small subq clotted veins Psychiatric: Pt behavior is normal without agitation  No other exam findings    Assessment & Plan:

## 2017-02-07 NOTE — Progress Notes (Signed)
I was available to supervise the injection. A. Kieanna Rollo, MD  

## 2017-02-07 NOTE — Progress Notes (Signed)
I was available to supervise the injection. A. Margot Oriordan, MD  

## 2017-02-08 ENCOUNTER — Encounter: Payer: Self-pay | Admitting: Internal Medicine

## 2017-02-08 ENCOUNTER — Telehealth: Payer: Self-pay | Admitting: Internal Medicine

## 2017-02-08 ENCOUNTER — Other Ambulatory Visit: Payer: BC Managed Care – PPO

## 2017-02-08 ENCOUNTER — Ambulatory Visit (INDEPENDENT_AMBULATORY_CARE_PROVIDER_SITE_OTHER): Payer: BC Managed Care – PPO | Admitting: Internal Medicine

## 2017-02-08 ENCOUNTER — Ambulatory Visit (HOSPITAL_COMMUNITY)
Admission: RE | Admit: 2017-02-08 | Discharge: 2017-02-08 | Disposition: A | Discharge status: Home / Self Care | Payer: BC Managed Care – PPO | Source: Ambulatory Visit | Attending: Surgery | Admitting: Surgery

## 2017-02-08 DIAGNOSIS — M79606 Pain in leg, unspecified: Secondary | ICD-10-CM | POA: Insufficient documentation

## 2017-02-08 DIAGNOSIS — I10 Essential (primary) hypertension: Secondary | ICD-10-CM

## 2017-02-08 DIAGNOSIS — M79605 Pain in left leg: Secondary | ICD-10-CM

## 2017-02-08 DIAGNOSIS — E538 Deficiency of other specified B group vitamins: Secondary | ICD-10-CM

## 2017-02-08 DIAGNOSIS — M79604 Pain in right leg: Secondary | ICD-10-CM | POA: Insufficient documentation

## 2017-02-08 MED ORDER — CYANOCOBALAMIN 1000 MCG/ML IJ SOLN
1000.0000 ug | Freq: Once | INTRAMUSCULAR | Status: AC
  Administered 2017-02-08: 1000 ug via INTRAMUSCULAR

## 2017-02-08 NOTE — Assessment & Plan Note (Signed)
BP Readings from Last 3 Encounters:  02/08/17 126/74  02/02/17 (!) 142/84  11/06/16 130/84

## 2017-02-08 NOTE — Telephone Encounter (Signed)
Tammy from [facility] called to let Dr Alain Marion know that the [facility] [place] of the left leg was negative. Pt is being sent home.

## 2017-02-08 NOTE — Progress Notes (Signed)
Subjective:  Patient ID: William Acosta, male    DOB: 09-15-1955  Age: 61 y.o. MRN: [id number]  CC: No chief complaint on file.   HPI William Acosta presents for B LE pain L>R x 2 weeks Pt [place]   Outpatient Medications Prior to Visit  Medication Sig Dispense Refill  . aspirin 81 MG EC tablet Take 1 tablet (81 mg total) by mouth daily. Swallow whole. 30 tablet 12  . cyanocobalamin (,VITAMIN B-12,) 1000 MCG/ML injection Inject 1,000 mcg into the muscle every 30 (thirty) days.    . ergocalciferol (VITAMIN D2) 50000 units capsule Take 1 capsule (50,000 Units total) by mouth every 30 (thirty) days. 12 capsule 3  . losartan (COZAAR) 100 MG tablet TAKE 1 TABLET BY MOUTH EVERY DAY 90 tablet 2  . triamcinolone (KENALOG) 0.025 % cream Apply 1 application topically 2 (two) times daily. 30 g 0   No facility-administered medications prior to visit.     ROS Review of Systems  Constitutional: Negative for appetite change, fatigue and unexpected weight change.  HENT: Negative for congestion, nosebleeds, sneezing, sore throat and trouble swallowing.   Eyes: Negative for itching and visual disturbance.  Respiratory: Negative for cough.   Cardiovascular: Negative for chest pain, palpitations and leg swelling.  Gastrointestinal: Negative for abdominal distention, blood in stool, diarrhea and nausea.  Genitourinary: Negative for frequency and hematuria.  Musculoskeletal: Positive for arthralgias. Negative for back pain, gait problem, joint swelling and neck pain.  Skin: Negative for rash.  Neurological: Negative for dizziness, tremors, speech difficulty and weakness.  Psychiatric/Behavioral: Negative for agitation, dysphoric mood and sleep disturbance. The patient is not nervous/anxious.     Objective:  BP 126/74 (BP Location: Left Arm, Patient Position: Sitting, Cuff Size: Large)   Pulse 69   Temp 98.2 F (36.8 C) (Oral)   Ht 5\' 9"  (1.753 m)   Wt 213  lb (96.6 kg)   SpO2 99%   BMI 31.45 kg/m   BP Readings from Last 3 Encounters:  02/08/17 126/74  02/02/17 (!) 142/84  11/06/16 130/84    Wt Readings from Last 3 Encounters:  02/08/17 213 lb (96.6 kg)  02/02/17 216 lb (98 kg)  11/06/16 213 lb 1.3 oz (96.7 kg)    Physical Exam  Constitutional: He is oriented to person, place, and time. He appears well-developed. No distress.  NAD  HENT:  Mouth/Throat: Oropharynx is clear and moist.  Eyes: Pupils are equal, round, and reactive to light. Conjunctivae are normal.  Neck: Normal range of motion. No JVD present. No thyromegaly present.  Cardiovascular: Normal rate, regular rhythm, normal heart sounds and intact distal pulses.  Exam reveals no gallop and no friction rub.   No murmur heard. Pulmonary/Chest: Effort normal and breath sounds normal. No respiratory distress. He has no wheezes. He has no rales. He exhibits no tenderness.  Abdominal: Soft. Bowel sounds are normal. He exhibits no distension and no mass. There is no tenderness. There is no rebound and no guarding.  Musculoskeletal: Normal range of motion. He exhibits no edema or tenderness.  Lymphadenopathy:    He has no cervical adenopathy.  Neurological: He is alert and oriented to person, place, and time. He has normal reflexes. No cranial nerve deficit. He exhibits normal muscle tone. He displays a negative Romberg sign. Coordination and gait normal.  Skin: Skin is warm and dry. No rash noted.  Psychiatric: He has a normal mood and affect. His behavior  is normal. Judgment and thought content normal.   B b anserine pain L>R  Lab Results  Component Value Date   WBC 9.7 10/30/2016   HGB 15.9 10/30/2016   HCT 46.8 10/30/2016   PLT 280.0 10/30/2016   GLUCOSE 90 10/30/2016   CHOL 173 10/30/2016   TRIG 103.0 10/30/2016   HDL 43.50 10/30/2016   LDLCALC 109 (H) 10/30/2016   ALT 17 10/30/2016   AST 17 10/30/2016   NA 142 10/30/2016   K 4.3 10/30/2016   CL 105 10/30/2016     CREATININE 1.01 10/30/2016   BUN 12 10/30/2016   CO2 28 10/30/2016   TSH 0.52 10/30/2016   PSA 2.65 10/30/2016   HGBA1C 5.5 03/19/2016    No results found.  Assessment & Plan:   There are no diagnoses linked to this encounter. I have discontinued William Acosta's triamcinolone. I am also having him maintain his ergocalciferol, cyanocobalamin, losartan, and aspirin.  No orders of the defined types were placed in this encounter.    Follow-up: No Follow-up on file.  Walker Kehr, MD

## 2017-02-08 NOTE — Assessment & Plan Note (Signed)
L>R D dimer LLE Duplex US

## 2017-02-08 NOTE — Addendum Note (Signed)
Addended by: Karren Cobble on: 02/08/2017 11:35 AM   Modules accepted: Orders

## 2017-02-08 NOTE — Assessment & Plan Note (Signed)
B12 today 

## 2017-02-08 NOTE — Patient Instructions (Signed)
Pes anserine bursitis

## 2017-02-09 LAB — D-DIMER, QUANTITATIVE: D-Dimer, Quant: 0.4 mcg/mL FEU (ref <0.50)

## 2017-02-11 NOTE — Telephone Encounter (Signed)
Pls inform the pt - negative for clot Thx

## 2017-02-12 ENCOUNTER — Encounter: Payer: Self-pay | Admitting: *Deleted

## 2017-02-12 NOTE — Telephone Encounter (Signed)
Patient called back.  Gave him Dr. Reola Calkins response.

## 2017-02-12 NOTE — Telephone Encounter (Signed)
Tried calling pt x's 2 no answer can't leave msg due to vm being full. Will mail out result letter w/MD response...Johny Chess

## 2017-05-10 ENCOUNTER — Other Ambulatory Visit (INDEPENDENT_AMBULATORY_CARE_PROVIDER_SITE_OTHER): Payer: BC Managed Care – PPO

## 2017-05-10 DIAGNOSIS — Z0000 Encounter for general adult medical examination without abnormal findings: Secondary | ICD-10-CM | POA: Diagnosis not present

## 2017-05-10 DIAGNOSIS — E538 Deficiency of other specified B group vitamins: Secondary | ICD-10-CM

## 2017-05-10 LAB — TESTOSTERONE: Testosterone: 433.51 ng/dL (ref 300.00–890.00)

## 2017-05-10 LAB — BASIC METABOLIC PANEL
BUN: 12 mg/dL (ref 6–23)
CALCIUM: 9.3 mg/dL (ref 8.4–10.5)
CO2: 29 meq/L (ref 19–32)
Chloride: 104 mEq/L (ref 96–112)
Creatinine, Ser: 0.98 mg/dL (ref 0.40–1.50)
GFR: 81.98 mL/min (ref >60.00)
GLUCOSE: 105 mg/dL — AB (ref 70–99)
Potassium: 4.5 mEq/L (ref 3.5–5.1)
SODIUM: 142 meq/L (ref 135–145)

## 2017-05-10 LAB — PSA: PSA: 2.42 ng/mL (ref 0.10–4.00)

## 2017-05-10 LAB — VITAMIN B12: Vitamin B-12: 260 pg/mL (ref 211–911)

## 2017-05-14 ENCOUNTER — Encounter: Payer: Self-pay | Admitting: Internal Medicine

## 2017-05-14 ENCOUNTER — Ambulatory Visit: Payer: BC Managed Care – PPO | Admitting: Internal Medicine

## 2017-05-14 VITALS — BP 118/76 | HR 65 | Temp 98.7°F | Ht 69.0 in | Wt 217.0 lb

## 2017-05-14 DIAGNOSIS — M65312 Trigger thumb, left thumb: Secondary | ICD-10-CM

## 2017-05-14 DIAGNOSIS — M65352 Trigger finger, left little finger: Secondary | ICD-10-CM

## 2017-05-14 DIAGNOSIS — M545 Low back pain, unspecified: Secondary | ICD-10-CM

## 2017-05-14 DIAGNOSIS — M65342 Trigger finger, left ring finger: Secondary | ICD-10-CM

## 2017-05-14 DIAGNOSIS — E538 Deficiency of other specified B group vitamins: Secondary | ICD-10-CM | POA: Diagnosis not present

## 2017-05-14 DIAGNOSIS — I10 Essential (primary) hypertension: Secondary | ICD-10-CM

## 2017-05-14 DIAGNOSIS — Z0000 Encounter for general adult medical examination without abnormal findings: Secondary | ICD-10-CM

## 2017-05-14 DIAGNOSIS — M65332 Trigger finger, left middle finger: Secondary | ICD-10-CM

## 2017-05-14 DIAGNOSIS — M65322 Trigger finger, left index finger: Secondary | ICD-10-CM

## 2017-05-14 MED ORDER — ERGOCALCIFEROL 1.25 MG (50000 UT) PO CAPS: 50000.0000 [IU] | ORAL_CAPSULE | ORAL | 3 refills | Status: DC

## 2017-05-14 MED ORDER — LOSARTAN POTASSIUM 100 MG PO TABS: 100.0000 mg | ORAL_TABLET | Freq: Every day | ORAL | 3 refills | Status: DC

## 2017-05-14 MED ORDER — CYANOCOBALAMIN 1000 MCG/ML IJ SOLN
1000.0000 ug | Freq: Once | INTRAMUSCULAR | Status: AC
  Administered 2017-05-14: 1000 ug via INTRAMUSCULAR

## 2017-05-14 NOTE — Assessment & Plan Note (Signed)
On B12 

## 2017-05-14 NOTE — Assessment & Plan Note (Signed)
Loose wt 

## 2017-05-14 NOTE — Patient Instructions (Signed)

## 2017-05-14 NOTE — Assessment & Plan Note (Signed)
Losartan 

## 2017-05-14 NOTE — Progress Notes (Signed)
Subjective:  Patient ID: William Acosta, male    DOB: 07-02-1955  Age: 61 y.o. MRN: [id number]  CC: No chief complaint on file.   HPI Gray Doering Guadarrama presents for HTN, B12 def C/o L trigger finger 3d  Outpatient Medications Prior to Visit  Medication Sig Dispense Refill  . aspirin 81 MG EC tablet Take 1 tablet (81 mg total) by mouth daily. Swallow whole. 30 tablet 12  . losartan (COZAAR) 100 MG tablet TAKE 1 TABLET BY MOUTH EVERY DAY 90 tablet 2  . cyanocobalamin (,VITAMIN B-12,) 1000 MCG/ML injection Inject 1,000 mcg into the muscle every 30 (thirty) days.    . ergocalciferol (VITAMIN D2) 50000 units capsule Take 1 capsule (50,000 Units total) by mouth every 30 (thirty) days. (Patient not taking: Reported on 05/14/2017) 12 capsule 3   No facility-administered medications prior to visit.     ROS Review of Systems  Constitutional: Negative for appetite change, fatigue and unexpected weight change.  HENT: Negative for congestion, nosebleeds, sneezing, sore throat and trouble swallowing.   Eyes: Negative for itching and visual disturbance.  Respiratory: Negative for cough.   Cardiovascular: Negative for chest pain, palpitations and leg swelling.  Gastrointestinal: Negative for abdominal distention, blood in stool, diarrhea and nausea.  Genitourinary: Negative for frequency and hematuria.  Musculoskeletal: Negative for back pain, gait problem, joint swelling and neck pain.  Skin: Negative for rash.  Neurological: Negative for dizziness, tremors, speech difficulty and weakness.  Psychiatric/Behavioral: Negative for agitation, dysphoric mood and sleep disturbance. The patient is not nervous/anxious.     Objective:  BP 118/76 (BP Location: Left Arm, Patient Position: Sitting, Cuff Size: Large)   Pulse 65   Temp 98.7 F (37.1 C) (Oral)   Ht 5\' 9"  (1.753 m)   Wt 217 lb (98.4 kg)   SpO2 99%   BMI 32.05 kg/m   BP Readings from Last 3 Encounters:  05/14/17 118/76  02/08/17  126/74  02/02/17 (!) 142/84    Wt Readings from Last 3 Encounters:  05/14/17 217 lb (98.4 kg)  02/08/17 213 lb (96.6 kg)  02/02/17 216 lb (98 kg)    Physical Exam  Constitutional: He is oriented to person, place, and time. He appears well-developed. No distress.  NAD  HENT:  Mouth/Throat: Oropharynx is clear and moist.  Eyes: Conjunctivae are normal. Pupils are equal, round, and reactive to light.  Neck: Normal range of motion. No JVD present. No thyromegaly present.  Cardiovascular: Normal rate, regular rhythm, normal heart sounds and intact distal pulses. Exam reveals no gallop and no friction rub.  No murmur heard. Pulmonary/Chest: Effort normal and breath sounds normal. No respiratory distress. He has no wheezes. He has no rales. He exhibits no tenderness.  Abdominal: Soft. Bowel sounds are normal. He exhibits no distension and no mass. There is no tenderness. There is no rebound and no guarding.  Musculoskeletal: Normal range of motion. He exhibits no edema or tenderness.  Lymphadenopathy:    He has no cervical adenopathy.  Neurological: He is alert and oriented to person, place, and time. He has normal reflexes. No cranial nerve deficit. He exhibits normal muscle tone. He displays a negative Romberg sign. Coordination and gait normal.  Skin: Skin is warm and dry. No rash noted.  Psychiatric: He has a normal mood and affect. His behavior is normal. Judgment and thought content normal.    Lab Results  Component Value Date   WBC 9.7 10/30/2016   HGB 15.9 10/30/2016  HCT 46.8 10/30/2016   PLT 280.0 10/30/2016   GLUCOSE 105 (H) 05/10/2017   CHOL 173 10/30/2016   TRIG 103.0 10/30/2016   HDL 43.50 10/30/2016   LDLCALC 109 (H) 10/30/2016   ALT 17 10/30/2016   AST 17 10/30/2016   NA 142 05/10/2017   K 4.5 05/10/2017   CL 104 05/10/2017   CREATININE 0.98 05/10/2017   BUN 12 05/10/2017   CO2 29 05/10/2017   TSH 0.52 10/30/2016   PSA 2.42 05/10/2017   HGBA1C 5.5  03/19/2016    No results found.  Assessment & Plan:   There are no diagnoses linked to this encounter. I am having Glory Buff. Karim maintain his ergocalciferol, cyanocobalamin, losartan, and aspirin.  No orders of the defined types were placed in this encounter.    Follow-up: No Follow-up on file.  Walker Kehr, MD

## 2017-05-14 NOTE — Assessment & Plan Note (Signed)
declined inj

## 2017-05-14 NOTE — Addendum Note (Signed)
Addended by: Karren Cobble on: 05/14/2017 08:38 AM   Modules accepted: Orders

## 2017-06-08 ENCOUNTER — Ambulatory Visit: Payer: BC Managed Care – PPO | Admitting: Internal Medicine

## 2017-06-11 ENCOUNTER — Ambulatory Visit: Payer: BC Managed Care – PPO

## 2017-08-19 ENCOUNTER — Ambulatory Visit: Payer: BC Managed Care – PPO | Admitting: Internal Medicine

## 2017-08-19 ENCOUNTER — Encounter: Payer: Self-pay | Admitting: Internal Medicine

## 2017-08-19 VITALS — BP 126/88 | HR 89 | Temp 97.9°F | Ht 69.0 in | Wt 220.0 lb

## 2017-08-19 DIAGNOSIS — J069 Acute upper respiratory infection, unspecified: Secondary | ICD-10-CM | POA: Diagnosis not present

## 2017-08-19 DIAGNOSIS — E538 Deficiency of other specified B group vitamins: Secondary | ICD-10-CM

## 2017-08-19 DIAGNOSIS — M549 Dorsalgia, unspecified: Secondary | ICD-10-CM

## 2017-08-19 DIAGNOSIS — J309 Allergic rhinitis, unspecified: Secondary | ICD-10-CM | POA: Diagnosis not present

## 2017-08-19 DIAGNOSIS — M65332 Trigger finger, left middle finger: Secondary | ICD-10-CM

## 2017-08-19 MED ORDER — CYANOCOBALAMIN 1000 MCG/ML IJ SOLN
1000.0000 ug | Freq: Once | INTRAMUSCULAR | Status: AC
  Administered 2017-08-19: 1000 ug via INTRAMUSCULAR

## 2017-08-19 MED ORDER — CYCLOBENZAPRINE HCL 5 MG PO TABS: 5.0000 mg | ORAL_TABLET | Freq: Three times a day (TID) | ORAL | 1 refills | Status: DC | PRN

## 2017-08-19 MED ORDER — AZITHROMYCIN 250 MG PO TABS: ORAL_TABLET | ORAL | 1 refills | Status: DC

## 2017-08-19 NOTE — Patient Instructions (Signed)
Please take all new medication as prescribed - the antibiotic, and muscle relaxer if needed  You can also take Delsym OTC for cough, and/or Mucinex (or it's generic off brand) for congestion, and tylenol as needed for pain.  You will be contacted regarding the referral for: Hand Surgury  Please continue all other medications as before, and refills have been done if requested.  Please have the pharmacy call with any other refills you may need.  Please keep your appointments with your specialists as you may have planned

## 2017-08-19 NOTE — Progress Notes (Signed)
Subjective:    Patient ID: William Acosta, male    DOB: 09-26-1955, 62 y.o.   MRN: [id number]  HPI  Here with multiple issues, nervous.   Here with 2-3 days acute onset fever, facial pain, pressure, headache, general weakness and malaise, and greenish d/c, with mild ST and cough, but pt denies wheezing, increased sob or doe, orthopnea, PND, increased LE swelling, palpitations, dizziness or syncope.  Does have several wks ongoing nasal allergy symptoms with clearish congestion, itch and sneezing, without fever, pain, ST, cough, swelling or wheezing.  Still has left middle finger mild trigger symptom, now ok with referral.  Has some tingling sensation recurrent to just below the left chest without rash or back pain.  Also has left post lat neck pain mild recurrent for several wks but Pt denies bowel or bladder change, fever, wt loss,  worsening UE pain/numbness/weakness, gait change or falls.  Does not seem related to left middle finger. Has a grandmother with RA, worried these symptoms may be related Past Medical History:  Diagnosis Date  . Allergic rhinitis   . Allergy   . Arthritis   . Cancer ([place])    skin melanoma on right leg 2010  . Depression   . GERD (gastroesophageal reflux disease)   . Hyperlipidemia   . Hypertension   . Multinodular goiter    Past Surgical History:  Procedure Laterality Date  . SALIVARY GLAND SURGERY     Right    reports that he has quit smoking. he has never used smokeless tobacco. He reports that he does not drink alcohol or use drugs. family history includes Diabetes in his other; Heart disease in his unknown relative; Hypertension in his mother. Allergies  Allergen Reactions  . Moxifloxacin     REACTION: felt bad   Current Outpatient Medications on File Prior to Visit  Medication Sig Dispense Refill  . aspirin 81 MG EC tablet Take 1 tablet (81 mg total) by mouth daily. Swallow whole. 30 tablet 12  . cyanocobalamin (,VITAMIN B-12,) 1000 MCG/ML  injection Inject 1,000 mcg into the muscle every 30 (thirty) days.    . ergocalciferol (VITAMIN D2) 50000 units capsule Take 1 capsule (50,000 Units total) every 30 (thirty) days by mouth. 12 capsule 3  . losartan (COZAAR) 100 MG tablet Take 1 tablet (100 mg total) daily by mouth. 90 tablet 3   No current facility-administered medications on file prior to visit.    Review of Systems  Constitutional: Negative for other unusual diaphoresis or sweats HENT: Negative for ear discharge or swelling Eyes: Negative for other worsening visual disturbances Respiratory: Negative for stridor or other swelling  Gastrointestinal: Negative for worsening distension or other blood Genitourinary: Negative for retention or other urinary change Musculoskeletal: Negative for other MSK pain or swelling Skin: Negative for color change or other new lesions Neurological: Negative for worsening tremors and other numbness  Psychiatric/Behavioral: Negative for worsening agitation or other fatigue All other system neg per pt    Objective:   Physical Exam BP 126/88   Pulse 89   Temp 97.9 F (36.6 C) (Oral)   Ht 5\' 9"  (1.753 m)   Wt 220 lb (99.8 kg)   SpO2 97%   BMI 32.49 kg/m  VS noted, mild ill Constitutional: Pt appears in NAD HENT: Head: NCAT.  Right Ear: External ear normal.  Left Ear: External ear normal.  Eyes: . Pupils are equal, round, and reactive to light. Conjunctivae and EOM are normal Bilat tm's with  mild erythema.  Max sinus areas mild tender.  Pharynx with mild erythema, no exudate   Nose: without d/c or deformity Neck: Neck supple. Gross normal ROM Cardiovascular: Normal rate and regular rhythm.   Pulmonary/Chest: Effort normal and breath sounds without rales or wheezing.  Abd:  Soft, NT, ND, + BS, no organomegaly Left post lat neck mild tender with spasm, + left middle finger trigger Neurological: Pt is alert. At baseline orientation, motor grossly intact Skin: Skin is warm. No rashes,  other new lesions, no LE edema Psychiatric: Pt behavior is normal without agitation  No other exam findings    Assessment & Plan:

## 2017-08-21 ENCOUNTER — Encounter: Payer: Self-pay | Admitting: Internal Medicine

## 2017-08-21 NOTE — Assessment & Plan Note (Addendum)
Mild to mod, for antibx course,  to f/u any worsening symptoms or concerns  Note:  Total time for pt hx, exam, review of record with pt in the room, determination of diagnoses and plan for further eval and tx is > 40 min, with over 50% spent in coordination and counseling of patient including the differential dx, tx, further evaluation and other management of acute URI, left neck and upper back pain, left middle trigger finger, B12 deficiency and allergic rhinitis

## 2017-08-21 NOTE — Assessment & Plan Note (Signed)
[facility] for hand surgury referral, consider steroid injection

## 2017-08-21 NOTE — Assessment & Plan Note (Signed)
Left upper back/post lat neck - for muscle relaxer prn

## 2017-08-21 NOTE — Assessment & Plan Note (Signed)
Also for b12 IM today,  to f/u any worsening symptoms or concerns  

## 2017-08-21 NOTE — Assessment & Plan Note (Signed)
Mild, cont mucinex otc prn

## 2017-09-29 ENCOUNTER — Ambulatory Visit (INDEPENDENT_AMBULATORY_CARE_PROVIDER_SITE_OTHER): Payer: BC Managed Care – PPO

## 2017-09-29 DIAGNOSIS — E538 Deficiency of other specified B group vitamins: Secondary | ICD-10-CM

## 2017-09-29 MED ORDER — CYANOCOBALAMIN 1000 MCG/ML IJ SOLN
1000.0000 ug | Freq: Once | INTRAMUSCULAR | Status: AC
  Administered 2017-09-29: 1000 ug via INTRAMUSCULAR

## 2017-10-29 ENCOUNTER — Other Ambulatory Visit (INDEPENDENT_AMBULATORY_CARE_PROVIDER_SITE_OTHER): Payer: BC Managed Care – PPO

## 2017-10-29 ENCOUNTER — Encounter: Payer: Self-pay | Admitting: Internal Medicine

## 2017-10-29 ENCOUNTER — Ambulatory Visit: Payer: BC Managed Care – PPO | Admitting: Internal Medicine

## 2017-10-29 VITALS — BP 118/78 | HR 89 | Temp 99.3°F | Ht 69.0 in | Wt 212.0 lb

## 2017-10-29 DIAGNOSIS — K219 Gastro-esophageal reflux disease without esophagitis: Secondary | ICD-10-CM | POA: Diagnosis not present

## 2017-10-29 DIAGNOSIS — E739 Lactose intolerance, unspecified: Secondary | ICD-10-CM

## 2017-10-29 DIAGNOSIS — R197 Diarrhea, unspecified: Secondary | ICD-10-CM

## 2017-10-29 DIAGNOSIS — J069 Acute upper respiratory infection, unspecified: Secondary | ICD-10-CM | POA: Diagnosis not present

## 2017-10-29 DIAGNOSIS — E538 Deficiency of other specified B group vitamins: Secondary | ICD-10-CM | POA: Diagnosis not present

## 2017-10-29 LAB — HEPATIC FUNCTION PANEL
ALK PHOS: 107 U/L (ref 39–117)
ALT: 15 U/L (ref 0–53)
AST: 18 U/L (ref 0–37)
Albumin: 4.4 g/dL (ref 3.5–5.2)
BILIRUBIN DIRECT: 0.2 mg/dL (ref 0.0–0.3)
TOTAL PROTEIN: 7.5 g/dL (ref 6.0–8.3)
Total Bilirubin: 0.9 mg/dL (ref 0.2–1.2)

## 2017-10-29 LAB — CBC WITH DIFFERENTIAL/PLATELET
BASOS ABS: 0.1 10*3/uL (ref 0.0–0.1)
Basophils Relative: 0.7 % (ref 0.0–3.0)
EOS ABS: 0 10*3/uL (ref 0.0–0.7)
Eosinophils Relative: 0.1 % (ref 0.0–5.0)
HEMATOCRIT: 49.4 % (ref 39.0–52.0)
Hemoglobin: 17.1 g/dL — ABNORMAL HIGH (ref 13.0–17.0)
LYMPHS PCT: 30.5 % (ref 12.0–46.0)
Lymphs Abs: 2.5 10*3/uL (ref 0.7–4.0)
MCHC: 34.6 g/dL (ref 30.0–36.0)
MCV: 89.4 fl (ref 78.0–100.0)
MONOS PCT: 5.6 % (ref 3.0–12.0)
Monocytes Absolute: 0.5 10*3/uL (ref 0.1–1.0)
NEUTROS PCT: 63.1 % (ref 43.0–77.0)
Neutro Abs: 5.2 10*3/uL (ref 1.4–7.7)
Platelets: 231 10*3/uL (ref 150.0–400.0)
RBC: 5.53 Mil/uL (ref 4.22–5.81)
RDW: 12.7 % (ref 11.5–15.5)
WBC: 8.3 10*3/uL (ref 4.0–10.5)

## 2017-10-29 LAB — URINALYSIS, ROUTINE W REFLEX MICROSCOPIC
BILIRUBIN URINE: NEGATIVE
HGB URINE DIPSTICK: NEGATIVE
Ketones, ur: >=80 — AB
LEUKOCYTES UA: NEGATIVE
Nitrite: NEGATIVE
RBC / HPF: NONE SEEN (ref 0–?)
Specific Gravity, Urine: 1.025 (ref 1.000–1.030)
Total Protein, Urine: NEGATIVE
Urine Glucose: NEGATIVE
Urobilinogen, UA: 0.2 (ref 0.0–1.0)
pH: 5.5 (ref 5.0–8.0)

## 2017-10-29 LAB — BASIC METABOLIC PANEL
BUN: 10 mg/dL (ref 6–23)
CALCIUM: 9.7 mg/dL (ref 8.4–10.5)
CHLORIDE: 103 meq/L (ref 96–112)
CO2: 26 meq/L (ref 19–32)
CREATININE: 0.94 mg/dL (ref 0.40–1.50)
GFR: 85.89 mL/min (ref >60.00)
Glucose, Bld: 90 mg/dL (ref 70–99)
Potassium: 4.1 mEq/L (ref 3.5–5.1)
Sodium: 140 mEq/L (ref 135–145)

## 2017-10-29 LAB — LIPASE: LIPASE: 34 U/L (ref 11.0–59.0)

## 2017-10-29 LAB — HEMOGLOBIN A1C: HEMOGLOBIN A1C: 5.5 % (ref 4.6–6.5)

## 2017-10-29 MED ORDER — CYANOCOBALAMIN 1000 MCG/ML IJ SOLN
1000.0000 ug | Freq: Once | INTRAMUSCULAR | Status: AC
Start: 2017-10-29 — End: 2017-10-29
  Administered 2017-10-29: 1000 ug via INTRAMUSCULAR

## 2017-10-29 MED ORDER — AZITHROMYCIN 250 MG PO TABS: ORAL_TABLET | ORAL | 0 refills | Status: DC

## 2017-10-29 NOTE — Patient Instructions (Signed)
Please take all new medication as prescribed - the zpack  OK to take the probiotic, and TUMS as needed for reflux for now  Please continue all other medications as before, and refills have been done if requested.  Please have the pharmacy call with any other refills you may need.  Please keep your appointments with your specialists as you may have planned  Please go to the LAB in the Basement (turn left off the elevator) for the tests to be done today  You will be contacted by phone if any changes need to be made immediately.  Otherwise, you will receive a letter about your results with an explanation, but please check with MyChart first.  Please remember to sign up for MyChart if you have not done so, as this will be important to you in the future with finding out test results, communicating by private email, and scheduling acute appointments online when needed.

## 2017-10-29 NOTE — Progress Notes (Signed)
Subjective:    Patient ID: William Acosta, male    DOB: 03/04/1956, 62 y.o.   MRN: [id number]  HPI   Here with 2-3 days acute onset fever, facial pain, pressure, headache, general weakness and malaise, and greenish d/c, with mild ST and cough, but pt denies chest pain, wheezing, increased sob or doe, orthopnea, PND, increased LE swelling, palpitations, dizziness or syncope.  Does have several wks ongoing nasal allergy symptoms with clearish congestion, itch and sneezing, without fever, pain, ST, cough, swelling or wheezing. though has allergies Also, normally has bid bowel movement that have been different with "blowouts" nonbloody watery x 2wks without fever or pain or n/v, incidentally more formed this am for the first time  Belching occas as well.   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  Has had mild worsening reflux, without dysphagia.  No other interval change or new complaints Past Medical History:  Diagnosis Date  . Allergic rhinitis   . Allergy   . Arthritis   . Cancer ([place])    skin melanoma on right leg 2010  . Depression   . GERD (gastroesophageal reflux disease)   . Hyperlipidemia   . Hypertension   . Multinodular goiter    Past Surgical History:  Procedure Laterality Date  . SALIVARY GLAND SURGERY     Right    reports that he has quit smoking. He has never used smokeless tobacco. He reports that he does not drink alcohol or use drugs. family history includes Diabetes in his other; Heart disease in his unknown relative; Hypertension in his mother. Allergies  Allergen Reactions  . Moxifloxacin     REACTION: felt bad   Current Outpatient Medications on File Prior to Visit  Medication Sig Dispense Refill  . aspirin 81 MG EC tablet Take 1 tablet (81 mg total) by mouth daily. Swallow whole. 30 tablet 12  . cyanocobalamin (,VITAMIN B-12,) 1000 MCG/ML injection Inject 1,000 mcg  into the muscle every 30 (thirty) days.    . cyclobenzaprine (FLEXERIL) 5 MG tablet Take 1 tablet (5 mg total) by mouth 3 (three) times daily as needed for muscle spasms. 40 tablet 1  . ergocalciferol (VITAMIN D2) 50000 units capsule Take 1 capsule (50,000 Units total) every 30 (thirty) days by mouth. 12 capsule 3  . losartan (COZAAR) 100 MG tablet Take 1 tablet (100 mg total) daily by mouth. 90 tablet 3   No current facility-administered medications on file prior to visit.    Review of Systems  Constitutional: Negative for other unusual diaphoresis or sweats HENT: Negative for ear discharge or swelling Eyes: Negative for other worsening visual disturbances Respiratory: Negative for stridor or other swelling  Gastrointestinal: Negative for worsening distension or other blood Genitourinary: Negative for retention or other urinary change Musculoskeletal: Negative for other MSK pain or swelling Skin: Negative for color change or other new lesions Neurological: Negative for worsening tremors and other numbness  Psychiatric/Behavioral: Negative for worsening agitation or other fatigue All other system neg per pt    Objective:   Physical Exam BP 118/78   Pulse 89   Temp 99.3 F (37.4 C) (Oral)   Ht 5\' 9"  (1.753 m)   Wt 212 lb (96.2 kg)   SpO2 97%   BMI 31.31 kg/m  VS noted,  Constitutional: Pt appears in NAD HENT: Head: NCAT.  Right Ear: External ear normal.  Left Ear: External ear normal.  Eyes: . Pupils are equal, round, and reactive to light. Conjunctivae and EOM are normal Nose: without d/c or deformity Neck: Neck supple. Gross normal ROM Cardiovascular: Normal rate and regular rhythm.   Pulmonary/Chest: Effort normal and breath sounds without rales or wheezing.  Abd:  Soft, ND, + BS, no organomegaly, NT except for very mild LLQ tender without guarding or rebound Neurological: Pt is alert. At baseline orientation, motor grossly intact Skin: Skin is warm. No rashes, other new  lesions, no LE edema Psychiatric: Pt behavior is normal without agitation         Assessment & Plan:

## 2017-10-30 NOTE — Assessment & Plan Note (Signed)
Etiology unclear, suspect viral illness recent possibly just now improving, for GI panel, immodium otc prn, cont to follow for any worsening symptoms, and consider GI referral

## 2017-10-30 NOTE — Assessment & Plan Note (Signed)
[employer] for [employer] prn for now and/or zantac otc prn, avoid PPI in light of current diarrhea,  to f/u any worsening symptoms or concerns

## 2017-10-30 NOTE — Assessment & Plan Note (Signed)
Mild to mod, for antibx course,  to f/u any worsening symptoms or concerns 

## 2017-10-30 NOTE — Assessment & Plan Note (Signed)
Lab Results  Component Value Date   HGBA1C 5.5 10/29/2017  stable overall by history and exam, recent data reviewed with pt, and pt to continue medical treatment as before,  to f/u any worsening symptoms or concerns

## 2017-11-05 ENCOUNTER — Encounter: Payer: Self-pay | Admitting: Internal Medicine

## 2017-11-05 ENCOUNTER — Other Ambulatory Visit: Payer: BC Managed Care – PPO

## 2017-11-05 ENCOUNTER — Ambulatory Visit: Payer: BC Managed Care – PPO | Admitting: Internal Medicine

## 2017-11-05 VITALS — BP 150/80 | HR 90 | Temp 99.3°F | Ht 69.0 in | Wt 213.2 lb

## 2017-11-05 DIAGNOSIS — E739 Lactose intolerance, unspecified: Secondary | ICD-10-CM | POA: Diagnosis not present

## 2017-11-05 DIAGNOSIS — R197 Diarrhea, unspecified: Secondary | ICD-10-CM

## 2017-11-05 DIAGNOSIS — I10 Essential (primary) hypertension: Secondary | ICD-10-CM | POA: Diagnosis not present

## 2017-11-05 MED ORDER — TELMISARTAN 80 MG PO TABS: 80.0000 mg | ORAL_TABLET | Freq: Every day | ORAL | 3 refills | Status: DC

## 2017-11-05 MED ORDER — METRONIDAZOLE 500 MG PO TABS: 500.0000 mg | ORAL_TABLET | Freq: Three times a day (TID) | ORAL | 0 refills | Status: AC

## 2017-11-05 NOTE — Assessment & Plan Note (Signed)
stable overall by history and exam, recent data reviewed with pt, and pt to continue medical treatment as before,  to f/u any worsening symptoms or concerns Lab Results  Component Value Date   HGBA1C 5.5 10/29/2017

## 2017-11-05 NOTE — Progress Notes (Signed)
Subjective:    Patient ID: William Acosta, male    DOB: Oct 13, 1955, 62 y.o.   MRN: [id number]  HPI  Here with persistent diarrheal watery stools for several wksl, vague abd discomfort but "not real pain" better with belching and passing gs, is non bloody, several stools per day, occas nausea and belching intermittent associated; nothing else makes better or worse but now assoc with low grade temp as well.  Just not improving.  No chills. Was not able to get the GI panel done last wk  Pt denies chest pain, increased sob or doe, wheezing, orthopnea, PND, increased LE swelling, palpitations, dizziness or syncope.  Quite nervous about this predicament   Pt denies polydipsia, polyuria,  Also asks for change losartan to another due to med recall Past Medical History:  Diagnosis Date  . Allergic rhinitis   . Allergy   . Arthritis   . Cancer ([place])    skin melanoma on right leg 2010  . Depression   . GERD (gastroesophageal reflux disease)   . Hyperlipidemia   . Hypertension   . Multinodular goiter    Past Surgical History:  Procedure Laterality Date  . SALIVARY GLAND SURGERY     Right    reports that he has quit smoking. He has never used smokeless tobacco. He reports that he does not drink alcohol or use drugs. family history includes Diabetes in his other; Heart disease in his unknown relative; Hypertension in his mother. Allergies  Allergen Reactions  . Moxifloxacin     REACTION: felt bad   Current Outpatient Medications on File Prior to Visit  Medication Sig Dispense Refill  . aspirin 81 MG EC tablet Take 1 tablet (81 mg total) by mouth daily. Swallow whole. 30 tablet 12  . cyanocobalamin (,VITAMIN B-12,) 1000 MCG/ML injection Inject 1,000 mcg into the muscle every 30 (thirty) days.    . ergocalciferol (VITAMIN D2) 50000 units capsule Take 1 capsule (50,000 Units total) every 30 (thirty) days by mouth. 12 capsule 3   No current facility-administered medications on file prior to  visit.    Review of Systems  Constitutional: Negative for other unusual diaphoresis or sweats HENT: Negative for ear discharge or swelling Eyes: Negative for other worsening visual disturbances Respiratory: Negative for stridor or other swelling  Gastrointestinal: Negative for worsening distension or other blood Genitourinary: Negative for retention or other urinary change Musculoskeletal: Negative for other MSK pain or swelling Skin: Negative for color change or other new lesions Neurological: Negative for worsening tremors and other numbness  Psychiatric/Behavioral: Negative for worsening agitation or other fatigue All other system neg per pt    Objective:   Physical Exam BP (!) 150/80 (BP Location: Left Arm, Patient Position: Sitting, Cuff Size: Normal)   Pulse 90   Temp 99.3 F (37.4 C) (Oral)   Ht 5\' 9"  (1.753 m)   Wt 213 lb 4 oz (96.7 kg)   SpO2 97%   BMI 31.49 kg/m  VS noted, nervous, mild ill Constitutional: Pt appears in NAD HENT: Head: NCAT.  Right Ear: External ear normal.  Left Ear: External ear normal.  Eyes: . Pupils are equal, round, and reactive to light. Conjunctivae and EOM are normal Nose: without d/c or deformity Neck: Neck supple. Gross normal ROM Cardiovascular: Normal rate and regular rhythm.   Pulmonary/Chest: Effort normal and breath sounds without rales or wheezing.  Abd:  Soft, NT, ND, + BS, no organomegaly Neurological: Pt is alert. At baseline orientation, motor grossly  intact Skin: Skin is warm. No rashes, other new lesions, no LE edema Psychiatric: Pt behavior is normal without agitation  No other exam findings    Assessment & Plan:

## 2017-11-05 NOTE — Patient Instructions (Addendum)
Please take all new medication as prescribed - the antibiotic  Ok to change the losartan to micardis 80 mg per day (works roughly the same)  Please continue all other medications as before, and refills have been done if requested.  Please have the pharmacy call with any other refills you may need.  Please keep your appointments with your specialists as you may have planned  Please go to the LAB in the Basement (turn left off the elevator) for the tests to be done today - the parasite testing  You will be contacted regarding the referral for: Dr Carlean Purl

## 2017-11-05 NOTE — Assessment & Plan Note (Signed)
Etiology unclear, for GI panel. Also ova and parasite, empiric flagyl and refer GI per pt request

## 2017-11-05 NOTE — Assessment & Plan Note (Addendum)
Mild elevated, liekly situational, o/w stable overall by history and exam, recent data reviewed with pt, and pt to change the losartan to  micardis asd,  to f/u any worsening symptoms or concerns BP Readings from Last 3 Encounters:  11/05/17 (!) 150/80  10/29/17 118/78  08/19/17 126/88

## 2017-11-08 LAB — OVA AND PARASITE EXAMINATION
CONCENTRATE RESULT:: NONE SEEN
SPECIMEN QUALITY:: ADEQUATE
TRICHROME RESULT:: NONE SEEN
VKL: 90573263

## 2017-11-08 LAB — GASTROINTESTINAL PATHOGEN PANEL PCR
C. DIFFICILE TOX A/B, PCR: NOT DETECTED
CAMPYLOBACTER, PCR: NOT DETECTED
CRYPTOSPORIDIUM, PCR: NOT DETECTED
E coli (ETEC) LT/ST PCR: NOT DETECTED
E coli (STEC) stx1/stx2, PCR: NOT DETECTED
E coli 0157, PCR: NOT DETECTED
GIARDIA LAMBLIA, PCR: NOT DETECTED
Norovirus, PCR: NOT DETECTED
ROTAVIRUS, PCR: NOT DETECTED
Salmonella, PCR: NOT DETECTED
Shigella, PCR: NOT DETECTED

## 2017-11-11 ENCOUNTER — Encounter: Payer: Self-pay | Admitting: Internal Medicine

## 2017-11-11 ENCOUNTER — Ambulatory Visit: Payer: BC Managed Care – PPO | Admitting: Internal Medicine

## 2017-11-11 VITALS — BP 126/72 | HR 64 | Temp 98.8°F | Ht 69.0 in | Wt 212.0 lb

## 2017-11-11 DIAGNOSIS — E538 Deficiency of other specified B group vitamins: Secondary | ICD-10-CM | POA: Diagnosis not present

## 2017-11-11 DIAGNOSIS — Z0000 Encounter for general adult medical examination without abnormal findings: Secondary | ICD-10-CM

## 2017-11-11 DIAGNOSIS — I10 Essential (primary) hypertension: Secondary | ICD-10-CM | POA: Diagnosis not present

## 2017-11-11 DIAGNOSIS — F411 Generalized anxiety disorder: Secondary | ICD-10-CM | POA: Diagnosis not present

## 2017-11-11 DIAGNOSIS — R197 Diarrhea, unspecified: Secondary | ICD-10-CM | POA: Diagnosis not present

## 2017-11-11 NOTE — Assessment & Plan Note (Signed)
Resolved

## 2017-11-11 NOTE — Progress Notes (Signed)
Subjective:  Patient ID: William Acosta, male    DOB: May 19, 1956  Age: 62 y.o. MRN: [id number]  CC: No chief complaint on file.   HPI William Acosta presents for recent diarrhea - treated for C diff F/u HTN, B12 def  Outpatient Medications Prior to Visit  Medication Sig Dispense Refill  . aspirin 81 MG EC tablet Take 1 tablet (81 mg total) by mouth daily. Swallow whole. 30 tablet 12  . cyanocobalamin (,VITAMIN B-12,) 1000 MCG/ML injection Inject 1,000 mcg into the muscle every 30 (thirty) days.    . ergocalciferol (VITAMIN D2) 50000 units capsule Take 1 capsule (50,000 Units total) every 30 (thirty) days by mouth. 12 capsule 3  . metroNIDAZOLE (FLAGYL) 500 MG tablet Take 1 tablet (500 mg total) by mouth 3 (three) times daily for 10 days. 30 tablet 0  . telmisartan (MICARDIS) 80 MG tablet Take 1 tablet (80 mg total) by mouth daily. 90 tablet 3   No facility-administered medications prior to visit.     ROS Review of Systems  Constitutional: Negative for appetite change, fatigue and unexpected weight change.  HENT: Negative for congestion, nosebleeds, sneezing, sore throat and trouble swallowing.   Eyes: Negative for itching and visual disturbance.  Respiratory: Negative for cough.   Cardiovascular: Negative for chest pain, palpitations and leg swelling.  Gastrointestinal: Negative for abdominal distention, blood in stool, diarrhea and nausea.  Genitourinary: Negative for frequency and hematuria.  Musculoskeletal: Negative for back pain, gait problem, joint swelling and neck pain.  Skin: Negative for rash.  Neurological: Negative for dizziness, tremors, speech difficulty and weakness.  Psychiatric/Behavioral: Negative for agitation, dysphoric mood and sleep disturbance. The patient is not nervous/anxious.     Objective:  BP 126/72 (BP Location: Left Arm, Patient Position: Sitting, Cuff Size: Large)   Pulse 64   Temp 98.8 F (37.1 C) (Oral)   Ht 5\' 9"  (1.753 m)   Wt 212 lb  (96.2 kg)   SpO2 98%   BMI 31.31 kg/m   BP Readings from Last 3 Encounters:  11/11/17 126/72  11/05/17 (!) 150/80  10/29/17 118/78    Wt Readings from Last 3 Encounters:  11/11/17 212 lb (96.2 kg)  11/05/17 213 lb 4 oz (96.7 kg)  10/29/17 212 lb (96.2 kg)    Physical Exam  Constitutional: He is oriented to person, place, and time. He appears well-developed. No distress.  NAD  HENT:  Mouth/Throat: Oropharynx is clear and moist.  Eyes: Pupils are equal, round, and reactive to light. Conjunctivae are normal.  Neck: Normal range of motion. No JVD present. No thyromegaly present.  Cardiovascular: Normal rate, regular rhythm, normal heart sounds and intact distal pulses. Exam reveals no gallop and no friction rub.  No murmur heard. Pulmonary/Chest: Effort normal and breath sounds normal. No respiratory distress. He has no wheezes. He has no rales. He exhibits no tenderness.  Abdominal: Soft. Bowel sounds are normal. He exhibits no distension and no mass. There is no tenderness. There is no rebound and no guarding.  Musculoskeletal: Normal range of motion. He exhibits no edema or tenderness.  Lymphadenopathy:    He has no cervical adenopathy.  Neurological: He is alert and oriented to person, place, and time. He has normal reflexes. No cranial nerve deficit. He exhibits normal muscle tone. He displays a negative Romberg sign. Coordination and gait normal.  Skin: Skin is warm and dry. No rash noted.  Psychiatric: He has a normal mood and affect. His behavior is normal.  Judgment and thought content normal.    Lab Results  Component Value Date   WBC 8.3 10/29/2017   HGB 17.1 (H) 10/29/2017   HCT 49.4 10/29/2017   PLT 231.0 10/29/2017   GLUCOSE 90 10/29/2017   CHOL 173 10/30/2016   TRIG 103.0 10/30/2016   HDL 43.50 10/30/2016   LDLCALC 109 (H) 10/30/2016   ALT 15 10/29/2017   AST 18 10/29/2017   NA 140 10/29/2017   K 4.1 10/29/2017   CL 103 10/29/2017   CREATININE 0.94  10/29/2017   BUN 10 10/29/2017   CO2 26 10/29/2017   TSH 0.52 10/30/2016   PSA 2.42 05/10/2017   HGBA1C 5.5 10/29/2017    No results found.  Assessment & Plan:   There are no diagnoses linked to this encounter. I am having William Buff. Acosta maintain his cyanocobalamin, aspirin, ergocalciferol, metroNIDAZOLE, and telmisartan.  No orders of the defined types were placed in this encounter.    Follow-up: No follow-ups on file.  Walker Kehr, MD

## 2017-11-11 NOTE — Assessment & Plan Note (Signed)
On B12 

## 2017-11-11 NOTE — Assessment & Plan Note (Signed)
Doing well 

## 2017-11-11 NOTE — Assessment & Plan Note (Signed)
losartan 

## 2017-11-23 ENCOUNTER — Encounter: Payer: Self-pay | Admitting: Internal Medicine

## 2017-12-31 ENCOUNTER — Other Ambulatory Visit: Payer: Self-pay

## 2017-12-31 ENCOUNTER — Ambulatory Visit (INDEPENDENT_AMBULATORY_CARE_PROVIDER_SITE_OTHER): Payer: BC Managed Care – PPO

## 2017-12-31 DIAGNOSIS — E538 Deficiency of other specified B group vitamins: Secondary | ICD-10-CM | POA: Diagnosis not present

## 2017-12-31 MED ORDER — CYANOCOBALAMIN 1000 MCG/ML IJ SOLN
1000.0000 ug | Freq: Once | INTRAMUSCULAR | Status: AC
  Administered 2017-12-31: 1000 ug via INTRAMUSCULAR

## 2018-01-21 ENCOUNTER — Ambulatory Visit: Payer: BC Managed Care – PPO | Admitting: Internal Medicine

## 2018-03-01 ENCOUNTER — Ambulatory Visit (INDEPENDENT_AMBULATORY_CARE_PROVIDER_SITE_OTHER): Payer: BC Managed Care – PPO | Admitting: *Deleted

## 2018-03-01 DIAGNOSIS — E538 Deficiency of other specified B group vitamins: Secondary | ICD-10-CM

## 2018-03-01 MED ORDER — CYANOCOBALAMIN 1000 MCG/ML IJ SOLN
1000.0000 ug | Freq: Once | INTRAMUSCULAR | Status: AC
  Administered 2018-03-01: 1000 ug via INTRAMUSCULAR

## 2018-03-25 ENCOUNTER — Encounter: Payer: Self-pay | Admitting: Internal Medicine

## 2018-03-25 ENCOUNTER — Ambulatory Visit: Payer: BC Managed Care – PPO | Admitting: Internal Medicine

## 2018-03-25 VITALS — BP 128/78 | HR 80 | Ht 67.5 in | Wt 216.1 lb

## 2018-03-25 DIAGNOSIS — Z1211 Encounter for screening for malignant neoplasm of colon: Secondary | ICD-10-CM | POA: Diagnosis not present

## 2018-03-25 DIAGNOSIS — K529 Noninfective gastroenteritis and colitis, unspecified: Secondary | ICD-10-CM | POA: Diagnosis not present

## 2018-03-25 DIAGNOSIS — R1310 Dysphagia, unspecified: Secondary | ICD-10-CM | POA: Diagnosis not present

## 2018-03-25 MED ORDER — PEG-KCL-NACL-NASULF-NA ASC-C 140 G PO SOLR: 1.0000 | Freq: Once | ORAL | 0 refills | Status: AC

## 2018-03-25 NOTE — Progress Notes (Signed)
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 male, business officer at [employer], sent by Dr. Cathlean Cower after having had a diarrheal illness 4 months ago.  Patient reports that he began to develop diarrhea.  For 2 weeks postprandial diarrhea with urgency.  Testing for enteric pathogens including C. difficile was negative.  He was empirically treated with metronidazole.  His problem resolved without recurrence.  His only complaints today are intermittent discomfort with defecation suggesting a fissure.  In addition intermittent solid food dysphagia without reflux symptoms.  No weight loss.  His only endoscopic procedure was colonoscopy 20 years ago to evaluate Hemoccult-positive stool and hematochezia.  He was found to have mild diverticulosis and internal hemorrhoids.  He knows that he is due for repeat colon cancer screening.  He requests a morning appointment.  Review of systems is otherwise negative.  Review of outside laboratory shows negative GI pathogen panel.  Normal comprehensive metabolic panel.  Unremarkable CBC.  No relevant recent imaging  REVIEW OF SYSTEMS:  All non-GI ROS negative unless otherwise stated in the HPI except for sinus and allergy trouble, arthritis  Past Medical History:  Diagnosis Date  . Allergic rhinitis   . Allergy   . Arthritis   . Cancer ([place])    skin melanoma on right leg 2010  . Depression   . Diverticulosis   . GERD (gastroesophageal reflux disease)   . Hyperlipidemia   . Hypertension   . Multinodular goiter     Past Surgical History:  Procedure Laterality Date  . SALIVARY GLAND SURGERY     Right    Social History William Acosta  reports that he quit smoking about 32 years ago. His smoking use included cigarettes. He has never used smokeless tobacco. He reports that he does not drink alcohol or use drugs.  family history includes Breast cancer in his maternal grandmother; Diabetes in his other; Heart attack in his father; Heart disease in  his father and unknown relative; Hypertension in his mother; Pancreatic cancer in his maternal grandmother.  Allergies  Allergen Reactions  . Moxifloxacin     REACTION: felt bad       PHYSICAL EXAMINATION: Vital signs: BP 128/78 (BP Location: Left Arm, Patient Position: Sitting, Cuff Size: Normal)   Pulse 80   Ht 5' 7.5" (1.715 m) Comment: height measured eithout shoes  Wt 216 lb 2 oz (98 kg)   BMI 33.35 kg/m   Constitutional: generally well-appearing, no acute distress Psychiatric: alert and oriented x3, cooperative Eyes: extraocular movements intact, anicteric, conjunctiva pink Mouth: oral pharynx moist, no lesions Neck: supple no lymphadenopathy Cardiovascular: heart regular rate and rhythm, no murmur Lungs: clear to auscultation bilaterally Abdomen: soft, nontender, nondistended, no obvious ascites, no peritoneal signs, normal bowel sounds, no organomegaly Rectal: Deferred until colonoscopy Extremities: no clubbing, cyanosis, or lower extremity edema bilaterally Skin: no lesions on visible extremities Neuro: No focal deficits.  Cranial nerves intact  ASSESSMENT:  1.  Acute infectious diarrhea, likely viral.  Resolved 2.  Intermittent solid food dysphagia.  Rule out peptic stricture or ring 3.  Colon cancer screening.  Overdue for follow-up.  Exam 1999 as described 4.  Questionable anal fissure  PLAN:  1.  Answer multiple questions regarding his diarrheal illness 2.  Schedule screening colonoscopy.The nature of the procedure, as well as the risks, benefits, and alternatives were carefully and thoroughly reviewed with the patient. Ample time for discussion and questions allowed. The patient understood, was satisfied, and  agreed to proceed. 3.  Schedule upper endoscopy with possible esophageal dilation.The nature of the procedure, as well as the risks, benefits, and alternatives were carefully and thoroughly reviewed with the patient. Ample time for discussion and questions  allowed. The patient understood, was satisfied, and agreed to proceed. 4.  May need PPI depending upon the findings on upper endoscopy 5.  Ongoing general medical care with Dr. Alain Marion

## 2018-03-25 NOTE — Patient Instructions (Signed)

## 2018-04-01 ENCOUNTER — Ambulatory Visit (INDEPENDENT_AMBULATORY_CARE_PROVIDER_SITE_OTHER): Payer: BC Managed Care – PPO

## 2018-04-01 DIAGNOSIS — E538 Deficiency of other specified B group vitamins: Secondary | ICD-10-CM

## 2018-04-01 MED ORDER — CYANOCOBALAMIN 1000 MCG/ML IJ SOLN
1000.0000 ug | Freq: Once | INTRAMUSCULAR | Status: AC
  Administered 2018-04-01: 1000 ug via INTRAMUSCULAR

## 2018-04-29 ENCOUNTER — Telehealth: Payer: Self-pay | Admitting: Internal Medicine

## 2018-04-29 ENCOUNTER — Encounter: Payer: Self-pay | Admitting: Internal Medicine

## 2018-04-29 NOTE — Telephone Encounter (Signed)
Patient has a rash "jock area", he states its better today and he thinks it happen because he was not using Dial soap and he sweats. Explained if its contagious or draining or he runs a fever he could not have exam. He denies any of that. Plenvu sample given to pt because it was not at his pharmacy. Patient will come to 3rd floor today and get copy of his instructions and Plenvu sample (lot [phone] exp. 03/2019).

## 2018-05-02 ENCOUNTER — Ambulatory Visit (INDEPENDENT_AMBULATORY_CARE_PROVIDER_SITE_OTHER): Payer: BC Managed Care – PPO

## 2018-05-02 DIAGNOSIS — E538 Deficiency of other specified B group vitamins: Secondary | ICD-10-CM | POA: Diagnosis not present

## 2018-05-02 MED ORDER — CYANOCOBALAMIN 1000 MCG/ML IJ SOLN
1000.0000 ug | Freq: Once | INTRAMUSCULAR | Status: AC
  Administered 2018-05-02: 1000 ug via INTRAMUSCULAR

## 2018-05-05 ENCOUNTER — Ambulatory Visit (AMBULATORY_SURGERY_CENTER): Payer: BC Managed Care – PPO | Admitting: Internal Medicine

## 2018-05-05 ENCOUNTER — Encounter: Payer: Self-pay | Admitting: Internal Medicine

## 2018-05-05 VITALS — BP 107/62 | HR 70 | Temp 97.8°F | Resp 14 | Ht 67.0 in | Wt 216.0 lb

## 2018-05-05 DIAGNOSIS — K6289 Other specified diseases of anus and rectum: Secondary | ICD-10-CM | POA: Diagnosis not present

## 2018-05-05 DIAGNOSIS — K222 Esophageal obstruction: Secondary | ICD-10-CM

## 2018-05-05 DIAGNOSIS — K449 Diaphragmatic hernia without obstruction or gangrene: Secondary | ICD-10-CM | POA: Diagnosis not present

## 2018-05-05 DIAGNOSIS — K621 Rectal polyp: Secondary | ICD-10-CM | POA: Diagnosis not present

## 2018-05-05 DIAGNOSIS — K635 Polyp of colon: Secondary | ICD-10-CM | POA: Diagnosis not present

## 2018-05-05 DIAGNOSIS — Z1211 Encounter for screening for malignant neoplasm of colon: Secondary | ICD-10-CM

## 2018-05-05 DIAGNOSIS — D122 Benign neoplasm of ascending colon: Secondary | ICD-10-CM

## 2018-05-05 DIAGNOSIS — K2970 Gastritis, unspecified, without bleeding: Secondary | ICD-10-CM | POA: Diagnosis not present

## 2018-05-05 DIAGNOSIS — R1310 Dysphagia, unspecified: Secondary | ICD-10-CM | POA: Diagnosis not present

## 2018-05-05 DIAGNOSIS — K2990 Gastroduodenitis, unspecified, without bleeding: Principal | ICD-10-CM

## 2018-05-05 MED ORDER — OMEPRAZOLE 40 MG PO CPDR: 40.0000 mg | DELAYED_RELEASE_CAPSULE | Freq: Every day | ORAL | 11 refills | Status: DC

## 2018-05-05 MED ORDER — SODIUM CHLORIDE 0.9 % IV SOLN: 500.0000 mL | Freq: Once | INTRAVENOUS | Status: DC

## 2018-05-05 NOTE — Patient Instructions (Signed)
HANDOUTS GIVEN FOR POLYPS, DIVERTICULOSIS, HEMORRHOIDS, ESOPHAGEAL STRICTURE, HIATAL HERNIA AND POST DILATION DIET  PICK UP NEW PRESCRIPTION AT YOUR PHARMACY TODAY  YOU HAD AN ENDOSCOPIC PROCEDURE TODAY AT [facility]:   Refer to the procedure report that was given to you for any specific questions about what was found during the examination.  If the procedure report does not answer your questions, please call your gastroenterologist to clarify.  If you requested that your care partner not be given the details of your procedure findings, then the procedure report has been included in a sealed envelope for you to review at your convenience later.  YOU SHOULD EXPECT: Some feelings of bloating in the abdomen. Passage of more gas than usual.  Walking can help get rid of the air that was put into your GI tract during the procedure and reduce the bloating. If you had a lower endoscopy (such as a colonoscopy or flexible sigmoidoscopy) you may notice spotting of blood in your stool or on the toilet paper. If you underwent a bowel prep for your procedure, you may not have a normal bowel movement for a few days.  Please Note:  You might notice some irritation and congestion in your nose or some drainage.  This is from the oxygen used during your procedure.  There is no need for concern and it should clear up in a day or so.  SYMPTOMS TO REPORT IMMEDIATELY:   Following lower endoscopy (colonoscopy or flexible sigmoidoscopy):  Excessive amounts of blood in the stool  Significant tenderness or worsening of abdominal pains  Swelling of the abdomen that is new, acute  Fever of 100F or higher   Following upper endoscopy (EGD)  Vomiting of blood or coffee ground material  New chest pain or pain under the shoulder blades  Painful or persistently difficult swallowing  New shortness of breath  Fever of 100F or higher  Black, tarry-looking stools  For urgent or emergent issues, a  gastroenterologist can be reached at any hour by calling [phone].   DIET:  We do recommend a small meal at first, but then you may proceed to your regular diet.  Drink plenty of fluids but you should avoid alcoholic beverages for 24 hours.  ACTIVITY:  You should plan to take it easy for the rest of today and you should NOT DRIVE or use heavy machinery until tomorrow (because of the sedation medicines used during the test).    FOLLOW UP: Our staff will call the number listed on your records the next business day following your procedure to check on you and address any questions or concerns that you may have regarding the information given to you following your procedure. If we do not reach you, we will leave a message.  However, if you are feeling well and you are not experiencing any problems, there is no need to return our call.  We will assume that you have returned to your regular daily activities without incident.  If any biopsies were taken you will be contacted by phone or by letter within the next 1-3 weeks.  Please call us at [phone] if you have not heard about the biopsies in 3 weeks.    SIGNATURES/CONFIDENTIALITY: You and/or your care partner have signed paperwork which will be entered into your electronic medical record.  These signatures attest to the fact that that the information above on your After Visit Summary has been reviewed and is understood.  Full responsibility of  the confidentiality of this discharge information lies with you and/or your care-partner. 

## 2018-05-05 NOTE — Progress Notes (Signed)
Report to PACU, RN, vss, BBS= Clear.  

## 2018-05-05 NOTE — Op Note (Signed)
[facility] Patient Name: William Acosta Procedure Date: 05/05/2018 10:47 AM MRN: [id number] Endoscopist: Docia Chuck. Henrene Pastor , MD Age: 62 Referring MD:  Date of Birth: Feb 01, 1956 Gender: Male Account #: [id number] Procedure:                Colonoscopy with cold snare polypectomy x 1; with                            biopsies Indications:              Screening for colorectal malignant neoplasm Medicines:                Monitored Anesthesia Care Procedure:                Pre-Anesthesia Assessment:                           - Prior to the procedure, a History and Physical                            was performed, and patient medications and                            allergies were reviewed. The patient's tolerance of                            previous anesthesia was also reviewed. The risks                            and benefits of the procedure and the sedation                            options and risks were discussed with the patient.                            All questions were answered, and informed consent                            was obtained. Prior Anticoagulants: The patient has                            taken no previous anticoagulant or antiplatelet                            agents. ASA Grade Assessment: II - A patient with                            mild systemic disease. After reviewing the risks                            and benefits, the patient was deemed in                            satisfactory condition to undergo the procedure.  After obtaining informed consent, the colonoscope                            was passed under direct vision. Throughout the                            procedure, the patient's blood pressure, pulse, and                            oxygen saturations were monitored continuously. The                            Colonoscope was introduced through the anus and                            advanced to the the  cecum, identified by                            appendiceal orifice and ileocecal valve. The                            ileocecal valve, appendiceal orifice, and rectum                            were photographed. The quality of the bowel                            preparation was excellent. The colonoscopy was                            performed without difficulty. The patient tolerated                            the procedure well. The bowel preparation used was                            Prepopik. Scope In: 10:56:29 AM Scope Out: 11:13:16 AM Scope Withdrawal Time: 0 hours 15 minutes 14 seconds  Total Procedure Duration: 0 hours 16 minutes 47 seconds  Findings:                 A 1 mm polyp was found in the ascending colon. The                            polyp was removed with a cold snare. Resection and                            retrieval were complete.                           Multiple diverticula were found in the sigmoid                            colon.  Internal hemorrhoids were found during retroflexion.                           The entire examined colon appeared otherwise normal                            on direct and retroflexion views except for mild                            nonspecific erythema in the distal rectum. Biopsies                            were taken with a cold forceps for histology. Complications:            No immediate complications. Estimated blood loss:                            None. Estimated Blood Loss:     Estimated blood loss: none. Impression:               - One 1 mm polyp in the ascending colon, removed                            with a cold snare. Resected and retrieved.                           - Diverticulosis in the sigmoid colon.                           - Internal hemorrhoids.                           - The entire examined colon is normal on direct and                            retroflexion views save mild  erythema in the distal                            rectum. Recommendation:           - Repeat colonoscopy in 5-10 years for surveillance.                           - Patient has a contact number available for                            emergencies. The signs and symptoms of potential                            delayed complications were discussed with the                            patient. Return to normal activities tomorrow.  Written discharge instructions were provided to the                            patient.                           - Resume previous diet.                           - Continue present medications.                           - Await pathology results.                           - EGD today. Please see report Docia Chuck. Henrene Pastor, MD 05/05/2018 11:26:01 AM This report has been signed electronically.

## 2018-05-05 NOTE — Progress Notes (Signed)
Pt states no changes in health history since his pre visit in September.

## 2018-05-05 NOTE — Progress Notes (Signed)
Called to room to assist during endoscopic procedure.  Patient ID and intended procedure confirmed with present staff. Received instructions for my participation in the procedure from the performing physician.  

## 2018-05-05 NOTE — Op Note (Signed)
[place] Patient Name: William Acosta Procedure Date: 05/05/2018 10:47 AM MRN: [id number] Endoscopist: Docia Chuck. Henrene Pastor , MD Age: 62 Referring MD:  Date of Birth: 1955/07/29 Gender: Male Account #: [id number] Procedure:                Upper GI endoscopy with biopsies and dilation                            esophagus 24 F Indications:              Dysphagia Medicines:                Monitored Anesthesia Care Procedure:                Pre-Anesthesia Assessment:                           - Prior to the procedure, a History and Physical                            was performed, and patient medications and                            allergies were reviewed. The patient's tolerance of                            previous anesthesia was also reviewed. The risks                            and benefits of the procedure and the sedation                            options and risks were discussed with the patient.                            All questions were answered, and informed consent                            was obtained. Prior Anticoagulants: The patient has                            taken no previous anticoagulant or antiplatelet                            agents. After reviewing the risks and benefits, the                            patient was deemed in satisfactory condition to                            undergo the procedure.                           After obtaining informed consent, the endoscope was  passed under direct vision. Throughout the                            procedure, the patient's blood pressure, pulse, and                            oxygen saturations were monitored continuously. The                            Endoscope was introduced through the mouth, and                            advanced to the second part of duodenum. The upper                            GI endoscopy was accomplished without difficulty.         The patient tolerated the procedure well. Scope In: Scope Out: Findings:                 One benign-appearing, intrinsic moderate stenosis                            was found 38 cm from the incisors. This stenosis                            measured 1.5 cm (inner diameter). The scope was                            withdrawn. Dilation was performed with a Maloney                            dilator with no resistance at 54 Fr.                           The exam of the esophagus revealed mild erythema                            and edema at the mucosal Z line consistent with                            reflux esophagitis.                           The stomach revealed patchy erythema throughout and                            a small sliding hiatal hernia. Biopsies were taken                            with a cold forceps for Helicobacter pylori testing                            using CLOtest.  The examined duodenum was normal.                           The cardia and gastric fundus were normal on                            retroflexion. Complications:            No immediate complications. Estimated Blood Loss:     Estimated blood loss: none. Impression:               1. GERD with esophageal stricture status post                            dilation                           2. Nonerosive gastritis                           3. Otherwise normal exam. Recommendation:           1. Prescribed omeprazole 40 mg daily; #30; 11                            refills                           2. Follow-up biopsies                           3. Post dilation diet                           4. Routine office follow-up with Dr. Henrene Pastor in 6 to                            8 weeks. Docia Chuck. Henrene Pastor, MD 05/05/2018 11:33:29 AM This report has been signed electronically.

## 2018-05-06 ENCOUNTER — Telehealth: Payer: Self-pay

## 2018-05-06 LAB — HELICOBACTER PYLORI SCREEN-BIOPSY: UREASE: POSITIVE — AB

## 2018-05-06 NOTE — Telephone Encounter (Signed)
  Follow up Call-  Call back number 05/05/2018  Post procedure Call Back phone  # [phone]  Permission to leave phone message Yes  Some recent data might be hidden     Patient questions:  Do you have a fever, pain , or abdominal swelling? No. Pain Score  0 *  Have you tolerated food without any problems? Yes.    Have you been able to return to your normal activities? Yes.    Do you have any questions about your discharge instructions: Diet   No. Medications  No. Follow up visit  No.  Do you have questions or concerns about your Care? No.  Actions: * If pain score is 4 or above: No action needed, pain <4.

## 2018-05-11 ENCOUNTER — Encounter: Payer: Self-pay | Admitting: Internal Medicine

## 2018-05-12 ENCOUNTER — Other Ambulatory Visit: Payer: Self-pay

## 2018-05-12 MED ORDER — CLARITHROMYCIN 500 MG PO TABS: 500.0000 mg | ORAL_TABLET | Freq: Two times a day (BID) | ORAL | 0 refills | Status: DC

## 2018-05-12 MED ORDER — OMEPRAZOLE 40 MG PO CPDR: 40.0000 mg | DELAYED_RELEASE_CAPSULE | Freq: Two times a day (BID) | ORAL | 0 refills | Status: DC

## 2018-05-12 MED ORDER — METRONIDAZOLE 250 MG PO TABS: 250.0000 mg | ORAL_TABLET | Freq: Four times a day (QID) | ORAL | 0 refills | Status: DC

## 2018-05-13 ENCOUNTER — Ambulatory Visit: Payer: BC Managed Care – PPO | Admitting: Internal Medicine

## 2018-05-16 ENCOUNTER — Telehealth: Payer: Self-pay | Admitting: Internal Medicine

## 2018-05-16 NOTE — Telephone Encounter (Signed)
Spoke with pharmacy and was told that they had received Prilosec rx, the third prescription sent that patient did not think had been received, but it was too soon for patient to pick it up.  Left this information on patient's voicemail.

## 2018-05-23 ENCOUNTER — Encounter: Payer: Self-pay | Admitting: Family Medicine

## 2018-05-23 ENCOUNTER — Ambulatory Visit: Payer: BC Managed Care – PPO | Admitting: Family Medicine

## 2018-05-23 VITALS — BP 120/70 | HR 117 | Temp 100.6°F | Ht 67.0 in

## 2018-05-23 DIAGNOSIS — J020 Streptococcal pharyngitis: Secondary | ICD-10-CM

## 2018-05-23 LAB — POCT RAPID STREP A (OFFICE): Rapid Strep A Screen: POSITIVE — AB

## 2018-05-23 MED ORDER — AMOXICILLIN 250 MG/5ML PO SUSR: 500.0000 mg | Freq: Three times a day (TID) | ORAL | 0 refills | Status: AC

## 2018-05-23 NOTE — Patient Instructions (Signed)
Strep Throat Strep throat is a bacterial infection of the throat. Your health care provider may call the infection tonsillitis or pharyngitis, depending on whether there is swelling in the tonsils or at the back of the throat. Strep throat is most common during the cold months of the year in children who are 5-64 years of age, but it can happen during any season in people of any age. This infection is spread from person to person (contagious) through coughing, sneezing, or close contact. What are the causes? Strep throat is caused by the bacteria called Streptococcus pyogenes. What increases the risk? This condition is more likely to develop in:  People who spend time in crowded places where the infection can spread easily.  People who have close contact with someone who has strep throat.  What are the signs or symptoms? Symptoms of this condition include:  Fever or chills.  Redness, swelling, or pain in the tonsils or throat.  Pain or difficulty when swallowing.  White or yellow spots on the tonsils or throat.  Swollen, tender glands in the neck or under the jaw.  Red rash all over the body (rare).  How is this diagnosed? This condition is diagnosed by performing a rapid strep test or by taking a swab of your throat (throat culture test). Results from a rapid strep test are usually ready in a few minutes, but throat culture test results are available after one or two days. How is this treated? This condition is treated with antibiotic medicine. Follow these instructions at home: Medicines  Take over-the-counter and prescription medicines only as told by your health care provider.  Take your antibiotic as told by your health care provider. Do not stop taking the antibiotic even if you start to feel better.  Have family members who also have a sore throat or fever tested for strep throat. They may need antibiotics if they have the strep infection. Eating and drinking  Do not  share food, drinking cups, or personal items that could cause the infection to spread to other people.  If swallowing is difficult, try eating soft foods until your sore throat feels better.  Drink enough fluid to keep your urine clear or pale yellow. General instructions  Gargle with a salt-water mixture 3-4 times per day or as needed. To make a salt-water mixture, completely dissolve -1 tsp of salt in 1 cup of warm water.  Make sure that all household members wash their hands well.  Get plenty of rest.  Stay home from school or work until you have been taking antibiotics for 24 hours.  Keep all follow-up visits as told by your health care provider. This is important. Contact a health care provider if:  The glands in your neck continue to get bigger.  You develop a rash, cough, or earache.  You cough up a thick liquid that is green, yellow-brown, or bloody.  You have pain or discomfort that does not get better with medicine.  Your problems seem to be getting worse rather than better.  You have a fever. Get help right away if:  You have new symptoms, such as vomiting, severe headache, stiff or painful neck, chest pain, or shortness of breath.  You have severe throat pain, drooling, or changes in your voice.  You have swelling of the neck, or the skin on the neck becomes red and tender.  You have signs of dehydration, such as fatigue, dry mouth, and decreased urination.  You become increasingly sleepy, or   you cannot wake up completely.  Your joints become red or painful. This information is not intended to replace advice given to you by your health care provider. Make sure you discuss any questions you have with your health care provider. Document Released: 06/12/2000 Document Revised: 02/12/2016 Document Reviewed: 10/08/2014 Elsevier Interactive Patient Education  2018 Elsevier Inc.  

## 2018-05-23 NOTE — Progress Notes (Signed)
Acute Office Visit  Subjective:    Patient ID: William Acosta, male    DOB: [birth year], 62 y.o.   MRN: [id number]  Chief Complaint  Patient presents with  . neck swollen    HPI Patient is in today for evaluation of his neck glands swelling. This has been going on since Friday. He went to see his dentist on Saturday and he started him on Clindamycin 150 mg capsules, 1 cap every 12 hours.  For 2 to 3-day history of sore throat on the left greater than right side associated with fever and pain with swallowing.  Mild headache.  Nausea yesterday but none today.  He just plain hurts when he tries to swallow.  He is having no issues swallowing his saliva.  He saw his dentist who started him on clindamycin 150 twice daily.  Past Medical History:  Diagnosis Date  . Allergic rhinitis   . Allergy   . Arthritis   . Cancer ([place])    skin melanoma on right leg 2010  . Depression   . Diverticulosis   . GERD (gastroesophageal reflux disease)   . Hyperlipidemia   . Hypertension   . Multinodular goiter     Past Surgical History:  Procedure Laterality Date  . SALIVARY GLAND SURGERY     Right    Family History  Problem Relation Age of Onset  . Hypertension Mother   . Heart disease Father   . Heart attack Father        smoker  . Diabetes Other        1st degree relative  . Heart disease Unknown   . Pancreatic cancer Maternal Grandmother   . Breast cancer Maternal Grandmother        mets to pancreas  . Colon cancer Neg Hx     Social History   Socioeconomic History  . Marital status: Single    Spouse name: Not on file  . Number of children: 0  . Years of education: Not on file  . Highest education level: Not on file  Occupational History  . Occupation: Youth worker  Social Needs  . Financial resource strain: Not on file  . Food insecurity:    Worry: Not on file    Inability: Not on file  . Transportation needs:    Medical: Not on file    Non-medical: Not on  file  Tobacco Use  . Smoking status: Former Smoker    Types: Cigarettes    Last attempt to quit: 1987    Years since quitting: 32.9  . Smokeless tobacco: Never Used  Substance and Sexual Activity  . Alcohol use: No  . Drug use: No  . Sexual activity: Not Currently  Lifestyle  . Physical activity:    Days per week: Not on file    Minutes per session: Not on file  . Stress: Not on file  Relationships  . Social connections:    Talks on phone: Not on file    Gets together: Not on file    Attends religious service: Not on file    Active member of club or organization: Not on file    Attends meetings of clubs or organizations: Not on file    Relationship status: Not on file  . Intimate partner violence:    Fear of current or ex partner: Not on file    Emotionally abused: Not on file    Physically abused: Not on file    Forced sexual  activity: Not on file  Other Topics Concern  . Not on file  Social History Narrative   Regular Exercise-Yes     Outpatient Medications Prior to Visit  Medication Sig Dispense Refill  . cyanocobalamin (,VITAMIN B-12,) 1000 MCG/ML injection Inject 1,000 mcg into the muscle every 30 (thirty) days.    . metroNIDAZOLE (FLAGYL) 250 MG tablet Take 1 tablet (250 mg total) by mouth 4 (four) times daily. 56 tablet 0  . omeprazole (PRILOSEC) 40 MG capsule Take 1 capsule (40 mg total) by mouth daily. 30 capsule 11  . omeprazole (PRILOSEC) 40 MG capsule Take 1 capsule (40 mg total) by mouth 2 (two) times daily. 28 capsule 0  . telmisartan (MICARDIS) 80 MG tablet Take 1 tablet (80 mg total) by mouth daily. 90 tablet 3  . clarithromycin (BIAXIN) 500 MG tablet Take 1 tablet (500 mg total) by mouth 2 (two) times daily. 28 tablet 0   Facility-Administered Medications Prior to Visit  Medication Dose Route Frequency Provider Last Rate Last Dose  . 0.9 %  sodium chloride infusion  500 mL Intravenous Once Irene Shipper, MD        Allergies  Allergen Reactions  .  Moxifloxacin     REACTION: felt bad    Review of Systems  Constitutional: Positive for fever and malaise/fatigue. Negative for chills and weight loss.  HENT: Positive for sore throat. Negative for congestion and sinus pain.   Eyes: Negative.   Respiratory: Negative.  Negative for stridor.   Cardiovascular: Negative.   Gastrointestinal: Positive for nausea. Negative for abdominal pain and vomiting.  Genitourinary: Negative.   Musculoskeletal: Negative for joint pain and myalgias.  Skin: Negative.   Neurological: Positive for headaches.  Psychiatric/Behavioral: Negative.        Objective:    Physical Exam  Constitutional: He is oriented to person, place, and time. He appears well-developed and well-nourished. No distress.  HENT:  Head: Normocephalic and atraumatic.  Right Ear: External ear normal.  Left Ear: External ear normal.  Mouth/Throat: Uvula is midline. Oropharyngeal exudate, posterior oropharyngeal edema and posterior oropharyngeal erythema present. No tonsillar abscesses.  Eyes: Pupils are equal, round, and reactive to light. Conjunctivae are normal. Right eye exhibits no discharge. Left eye exhibits no discharge. No scleral icterus.  Neck: Neck supple. No JVD present. No tracheal deviation present. No thyromegaly present.  Cardiovascular: Normal rate, regular rhythm and normal heart sounds.  Pulmonary/Chest: Effort normal and breath sounds normal. No stridor.  Lymphadenopathy:    He has cervical adenopathy.  Neurological: He is alert and oriented to person, place, and time.  Skin: Skin is warm and dry. No rash noted. He is not diaphoretic. No erythema.  Psychiatric: He has a normal mood and affect.    BP 120/70   Pulse (!) 117   Temp (!) 100.6 F (38.1 C)   Ht 5\' 7"  (1.702 m)   SpO2 97%   BMI 33.83 kg/m  Wt Readings from Last 3 Encounters:  05/05/18 216 lb (98 kg)  03/25/18 216 lb 2 oz (98 kg)  11/11/17 212 lb (96.2 kg)   BP Readings from Last 3  Encounters:  05/23/18 120/70  05/05/18 107/62  03/25/18 128/78   Health Maintenance Due  Topic Date Due  . Hepatitis C Screening  1956/01/21  . HIV Screening  11/21/1968    There are no preventive care reminders to display for this patient.   Lab Results  Component Value Date   TSH 0.52 10/30/2016  Lab Results  Component Value Date   WBC 8.3 10/29/2017   HGB 17.1 (H) 10/29/2017   HCT 49.4 10/29/2017   MCV 89.4 10/29/2017   PLT 231.0 10/29/2017   Lab Results  Component Value Date   NA 140 10/29/2017   K 4.1 10/29/2017   CO2 26 10/29/2017   GLUCOSE 90 10/29/2017   BUN 10 10/29/2017   CREATININE 0.94 10/29/2017   BILITOT 0.9 10/29/2017   ALKPHOS 107 10/29/2017   AST 18 10/29/2017   ALT 15 10/29/2017   PROT 7.5 10/29/2017   ALBUMIN 4.4 10/29/2017   CALCIUM 9.7 10/29/2017   GFR 85.89 10/29/2017   Lab Results  Component Value Date   CHOL 173 10/30/2016   Lab Results  Component Value Date   HDL 43.50 10/30/2016   Lab Results  Component Value Date   LDLCALC 109 (H) 10/30/2016   Lab Results  Component Value Date   TRIG 103.0 10/30/2016   Lab Results  Component Value Date   CHOLHDL 4 10/30/2016   Lab Results  Component Value Date   HGBA1C 5.5 10/29/2017       Assessment & Plan:   Problem List Items Addressed This Visit    None    Visit Diagnoses    Strep throat    -  Primary   Relevant Medications   amoxicillin (AMOXIL) 250 MG/5ML suspension       Meds ordered this encounter  Medications  . amoxicillin (AMOXIL) 250 MG/5ML suspension    Sig: Take 10 mLs (500 mg total) by mouth 3 (three) times daily for 10 days.    Dispense:  300 mL    Refill:  0    Patient will change out any toothbrushes that have been close to his.  He will start using a new toothbrush himself after his been on antibiotic for 24 hours.  He is to follow-up immediately trismus or difficulty swallowing his own saliva.

## 2018-06-02 ENCOUNTER — Ambulatory Visit: Payer: BC Managed Care – PPO

## 2018-06-09 ENCOUNTER — Ambulatory Visit: Payer: BC Managed Care – PPO

## 2018-06-09 ENCOUNTER — Ambulatory Visit: Payer: BC Managed Care – PPO | Admitting: Family Medicine

## 2018-06-09 VITALS — BP 124/80 | HR 88 | Temp 98.3°F | Resp 16 | Wt 210.0 lb

## 2018-06-09 DIAGNOSIS — R21 Rash and other nonspecific skin eruption: Secondary | ICD-10-CM

## 2018-06-09 DIAGNOSIS — E538 Deficiency of other specified B group vitamins: Secondary | ICD-10-CM

## 2018-06-09 MED ORDER — CYANOCOBALAMIN 1000 MCG/ML IJ SOLN
1000.0000 ug | Freq: Once | INTRAMUSCULAR | Status: AC
  Administered 2018-06-09: 1000 ug via INTRAMUSCULAR

## 2018-06-09 MED ORDER — TRIAMCINOLONE ACETONIDE 0.5 % EX OINT: 1.0000 "application " | TOPICAL_OINTMENT | Freq: Two times a day (BID) | CUTANEOUS | 0 refills | Status: DC

## 2018-06-09 NOTE — Progress Notes (Signed)
Patient ID: William Acosta, male   DOB: 1955/12/23, 62 y.o.   MRN: [id number]   PCP: Cassandria Anger, MD  Subjective:  William Acosta is a 62 y.o. year old very pleasant male patient who presents with a Rash: Initial distribution: Buttocks, described as dry that "comes and goes".  Prior history of this rash: One episode of 10/27/2017 Discomfort associated: No Associated symptoms: Intermittent pruritis previously but not present today Change in rash: No Denies:  Fever, chills, sweats, myalgias, difficulty swallowing, hoarseness, SOB, N/V Abdominal pain, or tightening throat. No new exposures of soaps, lotions, laundry detergent, fabric softeners, foods, medications, herbal supplements, STDs, plants, animals, or insects.  -started: two weeks , symptoms are not worsening  -previous treatments: coconut oil has provided limited benefit -sick contacts/travel/risks: denies flu exposure.  -Hx of: allergies  ROS-denies fever, SOB, NVD,   Pertinent Past Medical History- HTN,   Medications- reviewed  Current Outpatient Medications  Medication Sig Dispense Refill  . cyanocobalamin (,VITAMIN B-12,) 1000 MCG/ML injection Inject 1,000 mcg into the muscle every 30 (thirty) days.    . metroNIDAZOLE (FLAGYL) 250 MG tablet Take 1 tablet (250 mg total) by mouth 4 (four) times daily. 56 tablet 0  . omeprazole (PRILOSEC) 40 MG capsule Take 1 capsule (40 mg total) by mouth 2 (two) times daily. 28 capsule 0  . telmisartan (MICARDIS) 80 MG tablet Take 1 tablet (80 mg total) by mouth daily. 90 tablet 3   Current Facility-Administered Medications  Medication Dose Route Frequency Provider Last Rate Last Dose  . 0.9 %  sodium chloride infusion  500 mL Intravenous Once Irene Shipper, MD        Objective: BP 124/80   Pulse 88   Temp 98.3 F (36.8 C) (Oral)   Resp 16   Wt 210 lb (95.3 kg)   SpO2 98%   BMI 32.89 kg/m  Gen: NAD, resting comfortably HEENT: Oropharynx is clear and moist CV: RRR no  murmurs rubs or gallops Lungs: CTAB no crackles, wheeze, rhonchi Abdomen: soft/nontender/nondistended/normal bowel sounds. No rebound or guarding.  Ext: no edema Skin: warm, dry, scaly, light brown plaque on left and right buttocks. No evidence of infection or vesicles present. Neuro: grossly normal, moves all extremities  Assessment/Plan: 1. Rash  - triamcinolone ointment (KENALOG) 0.5 %; Apply 1 application topically 2 (two) times daily.  Dispense: 30 g; Refill: 0  2. B12 deficiency  - cyanocobalamin ((VITAMIN B-12)) injection 1,000 mcg  Rash History and exam today are suggestive of atopic dermatitis.  Symptomatic treatment with: triamcinolone for symptoms which has provided benefit for similar symptoms that occurred in 2018. We discussed treatment of dry skin and advised discontinuing use of Dial soap and trial of moisturizing products such as Dove, Aveeno, Eucerin, or Cetaphil. Further advised moisturizing as soon as he gets out of shower and dries.  Finally, we reviewed reasons to return to care including if symptoms worsen or persist or new concerns arise- once again particularly rash that does not improve,  shortness of breath, N/V, or fever. Further advised patient to keep a diary of food choices and also use dye/scent free products to determine if these might be contributing to symptom.  If no improvement, a dermatology referral can also be considered.   I spent 25 minutes with this patient and greater than 50% of the time was spent in face to face counseling regarding triggers for atopic dermatitis, treatments such as maintaining hydration of skin, and keeping a diary of  products. Further discussed use of scent free/dye free products that may also be a source of symptoms.   Laurita Quint, FNP

## 2018-06-09 NOTE — Patient Instructions (Addendum)
It was a pleasure to meet you today!  Please use ointment as prescribed.   Please consider discontinuing use of Dial soap and trial a use of a moisturizing soap such as Dove.  Also, consider moisturizing your skin right after you shower with products such as Eucerin, Aveeno, or Cetaphil.  If the ointment does not improve your symptoms, please follow up with your provider for further evaluation. A dermatology referral can also be considered.   Rash A rash is a change in the color of the skin. A rash can also change the way your skin feels. There are many different conditions and factors that can cause a rash. Follow these instructions at home: Pay attention to any changes in your symptoms. Follow these instructions to help with your condition: Medicine Take or apply over-the-counter and prescription medicines only as told by your doctor. These may include:  Corticosteroid cream.  Anti-itch lotions.  Oral antihistamines.  Skin Care  Put cool compresses on the affected areas.  Try taking a bath with: ? Epsom salts. Follow the instructions on the packaging. You can get these at your local pharmacy or grocery store. ? Baking soda. Pour a small amount into the bath as told by your doctor. ? Colloidal oatmeal. Follow the instructions on the packaging. You can get this at your local pharmacy or grocery store.  Try putting baking soda paste onto your skin. Stir water into baking soda until it gets like a paste.  Do not scratch or rub your skin.  Avoid covering the rash. Make sure the rash is exposed to air as much as possible. General instructions  Avoid hot showers or baths, which can make itching worse. A cold shower may help.  Avoid scented soaps, detergents, and perfumes. Use gentle soaps, detergents, perfumes, and other cosmetic products.  Avoid anything that causes your rash. Keep a journal to help track what causes your rash. Write down: ? What you eat. ? What cosmetic  products you use. ? What you drink. ? What you wear. This includes jewelry.  Keep all follow-up visits as told by your doctor. This is important. Contact a doctor if:  You sweat at night.  You lose weight.  You pee (urinate) more than normal.  You feel weak.  You throw up (vomit).  Your skin or the whites of your eyes look yellow (jaundice).  Your skin: ? Tingles. ? Is numb.  Your rash: ? Does not go away after a few days. ? Gets worse.  You are: ? More thirsty than normal. ? More tired than normal.  You have: ? New symptoms. ? Pain in your belly (abdomen). ? A fever. ? Watery poop (diarrhea). Get help right away if:  Your rash covers all or most of your body. The rash may or may not be painful.  You have blisters that: ? Are on top of the rash. ? Grow larger. ? Grow together. ? Are painful. ? Are inside your nose or mouth.  You have a rash that: ? Looks like purple pinprick-sized spots all over your body. ? Has a "bull's eye" or looks like a target. ? Is red and painful, causes your skin to peel, and is not from being in the sun too long. This information is not intended to replace advice given to you by your health care provider. Make sure you discuss any questions you have with your health care provider. Document Released: 12/02/2007 Document Revised: 11/21/2015 Document Reviewed: 10/31/2014 Elsevier Interactive Patient Education  2018 [employer] Inc.  

## 2018-06-30 ENCOUNTER — Ambulatory Visit: Payer: BC Managed Care – PPO | Admitting: Internal Medicine

## 2018-07-11 ENCOUNTER — Ambulatory Visit (INDEPENDENT_AMBULATORY_CARE_PROVIDER_SITE_OTHER): Payer: BC Managed Care – PPO | Admitting: Emergency Medicine

## 2018-07-11 DIAGNOSIS — Z23 Encounter for immunization: Secondary | ICD-10-CM | POA: Diagnosis not present

## 2018-07-11 MED ORDER — CYANOCOBALAMIN 1000 MCG/ML IJ SOLN
1000.0000 ug | Freq: Once | INTRAMUSCULAR | Status: AC
  Administered 2018-07-11: 1000 ug via INTRAMUSCULAR

## 2018-07-26 ENCOUNTER — Telehealth: Payer: Self-pay | Admitting: Internal Medicine

## 2018-07-26 MED ORDER — OMEPRAZOLE 40 MG PO CPDR: 40.0000 mg | DELAYED_RELEASE_CAPSULE | Freq: Every day | ORAL | 3 refills | Status: DC

## 2018-07-26 NOTE — Telephone Encounter (Signed)
Omeprazole sent to [facility]

## 2018-07-26 NOTE — Telephone Encounter (Signed)
Pt needs a refill of omeprazole sent to [facility] on [address].

## 2018-08-12 ENCOUNTER — Ambulatory Visit (INDEPENDENT_AMBULATORY_CARE_PROVIDER_SITE_OTHER): Payer: BC Managed Care – PPO

## 2018-08-12 ENCOUNTER — Other Ambulatory Visit: Payer: Self-pay | Admitting: Internal Medicine

## 2018-08-12 DIAGNOSIS — E538 Deficiency of other specified B group vitamins: Secondary | ICD-10-CM

## 2018-08-12 MED ORDER — CYANOCOBALAMIN 1000 MCG/ML IJ SOLN
1000.0000 ug | Freq: Once | INTRAMUSCULAR | Status: AC
  Administered 2018-08-12: 1000 ug via INTRAMUSCULAR

## 2018-08-12 NOTE — Progress Notes (Addendum)
[facility]  Medical screening examination/treatment/procedure(s) were performed by non-physician practitioner and as supervising physician I was immediately available for consultation/collaboration. I agree with above. James John, MD   

## 2018-08-19 ENCOUNTER — Ambulatory Visit: Payer: BC Managed Care – PPO | Admitting: Internal Medicine

## 2018-09-12 ENCOUNTER — Ambulatory Visit (INDEPENDENT_AMBULATORY_CARE_PROVIDER_SITE_OTHER): Payer: BC Managed Care – PPO | Admitting: *Deleted

## 2018-09-12 ENCOUNTER — Other Ambulatory Visit: Payer: Self-pay

## 2018-09-12 DIAGNOSIS — E538 Deficiency of other specified B group vitamins: Secondary | ICD-10-CM | POA: Diagnosis not present

## 2018-09-12 MED ORDER — CYANOCOBALAMIN 1000 MCG/ML IJ SOLN
1000.0000 ug | Freq: Once | INTRAMUSCULAR | Status: AC
  Administered 2018-09-12: 1000 ug via INTRAMUSCULAR

## 2018-09-15 NOTE — Progress Notes (Signed)
Medical screening examination/treatment/procedure(s) were performed by non-physician practitioner and as supervising physician I was immediately available for consultation/collaboration. I agree with above. Aleksei Plotnikov, MD  

## 2018-09-27 ENCOUNTER — Telehealth: Payer: Self-pay | Admitting: *Deleted

## 2018-09-27 NOTE — Telephone Encounter (Signed)
I have left a voicemail on patient's mobile number for him to call back. We need to change his office visit into a webex visit due to COVID-19 pandemic. Unfortunately his home number rings and then gives busy signal later on without availability to leave voicemail. Will attempt to call back again at a later time if I do not hear from him.

## 2018-09-27 NOTE — Telephone Encounter (Signed)
Pt called back and declined webex will cb to resched.

## 2018-09-30 ENCOUNTER — Ambulatory Visit: Payer: BC Managed Care – PPO | Admitting: Internal Medicine

## 2018-10-17 ENCOUNTER — Ambulatory Visit: Payer: BC Managed Care – PPO

## 2018-11-15 ENCOUNTER — Other Ambulatory Visit: Payer: Self-pay | Admitting: Internal Medicine

## 2018-12-07 ENCOUNTER — Ambulatory Visit (INDEPENDENT_AMBULATORY_CARE_PROVIDER_SITE_OTHER): Payer: BC Managed Care – PPO

## 2018-12-07 ENCOUNTER — Other Ambulatory Visit: Payer: Self-pay

## 2018-12-07 DIAGNOSIS — E538 Deficiency of other specified B group vitamins: Secondary | ICD-10-CM | POA: Diagnosis not present

## 2018-12-07 MED ORDER — CYANOCOBALAMIN 1000 MCG/ML IJ SOLN
1000.0000 ug | Freq: Once | INTRAMUSCULAR | Status: AC
  Administered 2018-12-07: 1000 ug via INTRAMUSCULAR

## 2019-01-02 NOTE — Progress Notes (Signed)
Medical screening examination/treatment/procedure(s) were performed by non-physician practitioner and as supervising physician I was immediately available for consultation/collaboration. I agree with above. Jaquana Geiger, MD  

## 2019-01-06 ENCOUNTER — Ambulatory Visit: Payer: BC Managed Care – PPO

## 2019-01-06 ENCOUNTER — Other Ambulatory Visit: Payer: Self-pay

## 2019-01-06 ENCOUNTER — Ambulatory Visit (INDEPENDENT_AMBULATORY_CARE_PROVIDER_SITE_OTHER): Payer: BC Managed Care – PPO

## 2019-01-06 DIAGNOSIS — E538 Deficiency of other specified B group vitamins: Secondary | ICD-10-CM

## 2019-01-06 MED ORDER — CYANOCOBALAMIN 1000 MCG/ML IJ SOLN
1000.0000 ug | Freq: Once | INTRAMUSCULAR | Status: AC
  Administered 2019-01-06: 15:00:00 1000 ug via INTRAMUSCULAR

## 2019-01-06 NOTE — Progress Notes (Signed)
b12 Injection given.   [name] J [name], MD  

## 2019-02-09 ENCOUNTER — Other Ambulatory Visit: Payer: Self-pay | Admitting: Internal Medicine

## 2019-02-10 ENCOUNTER — Ambulatory Visit: Payer: BC Managed Care – PPO

## 2019-02-13 ENCOUNTER — Ambulatory Visit (INDEPENDENT_AMBULATORY_CARE_PROVIDER_SITE_OTHER): Payer: BC Managed Care – PPO

## 2019-02-13 ENCOUNTER — Telehealth: Payer: Self-pay | Admitting: Internal Medicine

## 2019-02-13 DIAGNOSIS — E538 Deficiency of other specified B group vitamins: Secondary | ICD-10-CM

## 2019-02-13 MED ORDER — TELMISARTAN 80 MG PO TABS: 80.0000 mg | ORAL_TABLET | Freq: Every day | ORAL | 0 refills | Status: DC

## 2019-02-13 MED ORDER — CYANOCOBALAMIN 1000 MCG/ML IJ SOLN
1000.0000 ug | Freq: Once | INTRAMUSCULAR | Status: AC
  Administered 2019-02-13: 17:00:00 1000 ug via INTRAMUSCULAR

## 2019-02-13 NOTE — Telephone Encounter (Signed)
Medication Refill - Medication: telmisartan (MICARDIS) 80 MG tablet    Has the patient contacted their pharmacy? No. Pt states he only has one pill left for tonight. Please advise.  (Agent: If no, request that the patient contact the pharmacy for the refill.) (Agent: If yes, when and what did the pharmacy advise?)  Preferred Pharmacy (with phone number or street name):  [facility] [place]  [address] [place] [address]  Phone: [phone] Fax: [phone]  Not a 24 hour pharmacy; exact hours not known.     Agent: Please be advised that RX refills may take up to 3 business days. We ask that you follow-up with your pharmacy.

## 2019-02-13 NOTE — Telephone Encounter (Signed)
Refill sent. See meds. Patient needs OV. LOV w/ PCP was 11/11/2017

## 2019-02-18 NOTE — Progress Notes (Signed)
Medical screening examination/treatment/procedure(s) were performed by non-physician practitioner and as supervising physician I was immediately available for consultation/collaboration. I agree with above. Aleksei Plotnikov, MD  

## 2019-03-08 ENCOUNTER — Other Ambulatory Visit: Payer: Self-pay | Admitting: Internal Medicine

## 2019-03-13 ENCOUNTER — Telehealth: Payer: Self-pay | Admitting: Internal Medicine

## 2019-03-13 MED ORDER — TELMISARTAN 80 MG PO TABS: 80.0000 mg | ORAL_TABLET | Freq: Every day | ORAL | 0 refills | Status: DC

## 2019-03-13 NOTE — Telephone Encounter (Signed)
Patient has scheduled an appointment with Dr. Camila Li for 9/28.  States is currently out of telmisartan.  [facility] to get him through until this appt time.  Please follow up with patient in regard.

## 2019-03-13 NOTE — Telephone Encounter (Signed)
LM notifying pt RX sent

## 2019-03-16 ENCOUNTER — Ambulatory Visit (INDEPENDENT_AMBULATORY_CARE_PROVIDER_SITE_OTHER): Payer: BC Managed Care – PPO

## 2019-03-16 ENCOUNTER — Other Ambulatory Visit: Payer: Self-pay

## 2019-03-16 DIAGNOSIS — E538 Deficiency of other specified B group vitamins: Secondary | ICD-10-CM | POA: Diagnosis not present

## 2019-03-16 MED ORDER — CYANOCOBALAMIN 1000 MCG/ML IJ SOLN
1000.0000 ug | Freq: Once | INTRAMUSCULAR | Status: AC
  Administered 2019-03-16: 1000 ug via INTRAMUSCULAR

## 2019-03-27 ENCOUNTER — Ambulatory Visit (INDEPENDENT_AMBULATORY_CARE_PROVIDER_SITE_OTHER): Payer: BC Managed Care – PPO | Admitting: Internal Medicine

## 2019-03-27 ENCOUNTER — Encounter: Payer: Self-pay | Admitting: Internal Medicine

## 2019-03-27 ENCOUNTER — Other Ambulatory Visit: Payer: Self-pay

## 2019-03-27 VITALS — BP 116/80 | HR 77 | Temp 98.1°F | Ht 67.0 in | Wt 214.0 lb

## 2019-03-27 DIAGNOSIS — Z23 Encounter for immunization: Secondary | ICD-10-CM

## 2019-03-27 DIAGNOSIS — E538 Deficiency of other specified B group vitamins: Secondary | ICD-10-CM | POA: Diagnosis not present

## 2019-03-27 DIAGNOSIS — F413 Other mixed anxiety disorders: Secondary | ICD-10-CM | POA: Diagnosis not present

## 2019-03-27 DIAGNOSIS — Z0000 Encounter for general adult medical examination without abnormal findings: Secondary | ICD-10-CM

## 2019-03-27 DIAGNOSIS — F439 Reaction to severe stress, unspecified: Secondary | ICD-10-CM

## 2019-03-27 DIAGNOSIS — I10 Essential (primary) hypertension: Secondary | ICD-10-CM

## 2019-03-27 MED ORDER — ALPRAZOLAM 0.25 MG PO TABS: 0.2500 mg | ORAL_TABLET | Freq: Two times a day (BID) | ORAL | 0 refills | Status: DC | PRN

## 2019-03-27 MED ORDER — TELMISARTAN 80 MG PO TABS: 80.0000 mg | ORAL_TABLET | Freq: Every day | ORAL | 3 refills | Status: DC

## 2019-03-27 NOTE — Progress Notes (Signed)
Subjective:  Patient ID: William Acosta, male    DOB: 1955-07-10  Age: 63 y.o. MRN: [id number]  CC: No chief complaint on file.   HPI William Acosta presents for a well exam C/o stress w/ill mother; anxiety   Outpatient Medications Prior to Visit  Medication Sig Dispense Refill  . cyanocobalamin (,VITAMIN B-12,) 1000 MCG/ML injection Inject 1,000 mcg into the muscle every 30 (thirty) days.    . metroNIDAZOLE (FLAGYL) 250 MG tablet Take 1 tablet (250 mg total) by mouth 4 (four) times daily. 56 tablet 0  . omeprazole (PRILOSEC) 40 MG capsule Take 1 capsule (40 mg total) by mouth daily. 90 capsule 3  . telmisartan (MICARDIS) 80 MG tablet Take 1 tablet (80 mg total) by mouth daily. Must keep appointment to get refills 30 tablet 0  . triamcinolone ointment (KENALOG) 0.5 % Apply 1 application topically 2 (two) times daily. 30 g 0  . omeprazole (PRILOSEC) 40 MG capsule Take 1 capsule (40 mg total) by mouth 2 (two) times daily. 28 capsule 0   Facility-Administered Medications Prior to Visit  Medication Dose Route Frequency Provider Last Rate Last Dose  . 0.9 %  sodium chloride infusion  500 mL Intravenous Once Irene Shipper, MD        ROS: Review of Systems  Constitutional: Positive for unexpected weight change. Negative for appetite change and fatigue.  HENT: Negative for congestion, nosebleeds, sneezing, sore throat and trouble swallowing.   Eyes: Negative for itching and visual disturbance.  Respiratory: Negative for cough.   Cardiovascular: Negative for chest pain, palpitations and leg swelling.  Gastrointestinal: Negative for abdominal distention, blood in stool, diarrhea and nausea.  Genitourinary: Negative for frequency and hematuria.  Musculoskeletal: Negative for back pain, gait problem, joint swelling and neck pain.  Skin: Negative for rash.  Neurological: Negative for dizziness, tremors, speech difficulty and weakness.  Psychiatric/Behavioral: Negative for agitation,  dysphoric mood, sleep disturbance and suicidal ideas. The patient is nervous/anxious.     Objective:  BP 116/80 (BP Location: Left Arm, Patient Position: Sitting, Cuff Size: Large)   Pulse 77   Temp 98.1 F (36.7 C) (Oral)   Ht 5\' 7"  (1.702 m)   Wt 214 lb (97.1 kg)   SpO2 97%   BMI 33.52 kg/m   BP Readings from Last 3 Encounters:  03/27/19 116/80  06/09/18 124/80  05/23/18 120/70    Wt Readings from Last 3 Encounters:  03/27/19 214 lb (97.1 kg)  06/09/18 210 lb (95.3 kg)  05/05/18 216 lb (98 kg)    Physical Exam Constitutional:      General: He is not in acute distress.    Appearance: He is well-developed.     Comments: NAD  Eyes:     Conjunctiva/sclera: Conjunctivae normal.     Pupils: Pupils are equal, round, and reactive to light.  Neck:     Musculoskeletal: Normal range of motion.     Thyroid: No thyromegaly.     Vascular: No JVD.  Cardiovascular:     Rate and Rhythm: Normal rate and regular rhythm.     Heart sounds: Normal heart sounds. No murmur. No friction rub. No gallop.   Pulmonary:     Effort: Pulmonary effort is normal. No respiratory distress.     Breath sounds: Normal breath sounds. No wheezing or rales.  Chest:     Chest wall: No tenderness.  Abdominal:     General: Bowel sounds are normal. There is no distension.  Palpations: Abdomen is soft. There is no mass.     Tenderness: There is no abdominal tenderness. There is no guarding or rebound.  Musculoskeletal: Normal range of motion.        General: No tenderness.  Lymphadenopathy:     Cervical: No cervical adenopathy.  Skin:    General: Skin is warm and dry.     Findings: No rash.  Neurological:     Mental Status: He is alert and oriented to person, place, and time.     Cranial Nerves: No cranial nerve deficit.     Motor: No abnormal muscle tone.     Coordination: Coordination normal.     Gait: Gait normal.     Deep Tendon Reflexes: Reflexes are normal and symmetric.  Psychiatric:         Behavior: Behavior normal.        Thought Content: Thought content normal.        Judgment: Judgment normal.      Pt declined rectal  Lab Results  Component Value Date   WBC 8.3 10/29/2017   HGB 17.1 (H) 10/29/2017   HCT 49.4 10/29/2017   PLT 231.0 10/29/2017   GLUCOSE 90 10/29/2017   CHOL 173 10/30/2016   TRIG 103.0 10/30/2016   HDL 43.50 10/30/2016   LDLCALC 109 (H) 10/30/2016   ALT 15 10/29/2017   AST 18 10/29/2017   NA 140 10/29/2017   K 4.1 10/29/2017   CL 103 10/29/2017   CREATININE 0.94 10/29/2017   BUN 10 10/29/2017   CO2 26 10/29/2017   TSH 0.52 10/30/2016   PSA 2.42 05/10/2017   HGBA1C 5.5 10/29/2017    No results found.  Assessment & Plan:   There are no diagnoses linked to this encounter.   No orders of the defined types were placed in this encounter.    Follow-up: No follow-ups on file.  Walker Kehr, MD

## 2019-03-27 NOTE — Assessment & Plan Note (Signed)
On B12 

## 2019-03-27 NOTE — Assessment & Plan Note (Signed)
Losartan 

## 2019-03-27 NOTE — Patient Instructions (Signed)
These suggestions will probably help you to improve your metabolism if you are not overweight and to lose weight if you are overweight: 1.  Reduce your consumption of sugars and starches.  Eliminate high fructose corn syrup from your diet.  Reduce your consumption of processed foods.  For desserts try to have seasonal fruits, berries, nuts, cheeses or dark chocolate with more than 70% cacao. 2.  Do not snack 3.  You do not have to eat breakfast.  If you choose to have breakfast-eat plain greek yogurt, eggs, oatmeal (without sugar) 4.  Drink water, freshly brewed unsweetened tea (green, black or herbal) or coffee.  Do not drink sodas including diet sodas , juices, beverages sweetened with artificial sweeteners. 5.  Reduce your consumption of refined grains. 6.  Avoid protein drinks such as Optifast, Slim fast etc. Eat chicken, fish, meat, dairy and beans for your sources of protein 7.  Natural unprocessed fats like cold pressed virgin olive oil, butter, coconut oil are good for you.  Eat avocados 8.  Increase your consumption of fiber.  Fruits, berries, vegetables, whole grains, flaxseeds, Chia seeds, beans, popcorn, nuts, oatmeal are good sources of fiber 9.  Use vinegar in your diet, i.e. apple cider vinegar, red wine or balsamic vinegar 10.  You can try fasting.  For example you can skip breakfast and lunch every other day (24-hour fast) 11.  Stress reduction, good night sleep, relaxation, meditation, yoga and other physical activity is likely to help you to maintain low weight too. 12.  If you drink alcohol, limit your alcohol intake to no more than 2 drinks a day.   Mediterranean diet is good for you. ([employer] has a typical Mediterranean cuisine menu) The Mediterranean diet is a way of eating based on the traditional cuisine of countries bordering the [place] Sea. While there is no single definition of the Mediterranean diet, it is typically high in vegetables, fruits, whole grains,  beans, nut and seeds, and olive oil. The main components of Mediterranean diet include: . Daily consumption of vegetables, fruits, whole grains and healthy fats  . Weekly intake of fish, poultry, beans and eggs  . Moderate portions of dairy products  . Limited intake of red meat Other important elements of the Mediterranean diet are sharing meals with family and friends, enjoying a glass of red wine and being physically active. Health benefits of a Mediterranean diet: A traditional Mediterranean diet consisting of large quantities of fresh fruits and vegetables, nuts, fish and olive oil-coupled with physical activity-can reduce your risk of serious mental and physical health problems by: Preventing heart disease and strokes. Following a Mediterranean diet limits your intake of refined breads, processed foods, and red meat, and encourages drinking red wine instead of hard liquor-all factors that can help prevent heart disease and stroke. Keeping you agile. If you're an older adult, the nutrients gained with a Mediterranean diet may reduce your risk of developing muscle weakness and other signs of frailty by about 70 percent. Reducing the risk of Alzheimer's. Research suggests that the Mediterranean diet may improve cholesterol, blood sugar levels, and overall blood vessel health, which in turn may reduce your risk of Alzheimer's disease or dementia. Halving the risk of Parkinson's disease. The high levels of antioxidants in the Mediterranean diet can prevent cells from undergoing a damaging process called oxidative stress, thereby cutting the risk of Parkinson's disease in half. Increasing longevity. By reducing your risk of developing heart disease or cancer with the Mediterranean diet,   you're reducing your risk of death at any age by 20%. Protecting against type 2 diabetes. A Mediterranean diet is rich in fiber which digests slowly, prevents huge swings in blood sugar, and can help you maintain a  healthy weight.    Cabbage soup recipe that will not make you gain weight: Take 1 small head of cabbage, 1 average pack of celery, 4 green peppers, 4 onions, 2 cans diced tomatoes (they are not available without salt), salt and spices to taste.  Chop cabbage, celery, peppers and onions.  And tomatoes and 2-2.5 liters (2.5 quarts) of water so that it would just cover the vegetables.  Bring to boil.  Add spices and salt.  Turn heat to low/medium and simmer for 20-25 minutes.  Naturally, you can make a smaller batch and change some of the ingredients.   Cardiac CT calcium scoring test $150   Computed tomography, more commonly known as a CT or CAT scan, is a diagnostic medical imaging test. Like traditional x-rays, it produces multiple images or pictures of the inside of the body. The cross-sectional images generated during a CT scan can be reformatted in multiple planes. They can even generate three-dimensional images. These images can be viewed on a computer monitor, printed on film or by a 3D printer, or transferred to a CD or DVD. CT images of internal organs, bones, soft tissue and blood vessels provide greater detail than traditional x-rays, particularly of soft tissues and blood vessels. A cardiac CT scan for coronary calcium is a non-invasive way of obtaining information about the presence, location and extent of calcified plaque in the coronary arteries-the vessels that supply oxygen-containing blood to the heart muscle. Calcified plaque results when there is a build-up of fat and other substances under the inner layer of the artery. This material can calcify which signals the presence of atherosclerosis, a disease of the vessel wall, also called coronary artery disease (CAD). People with this disease have an increased risk for heart attacks. In addition, over time, progression of plaque build up (CAD) can narrow the arteries or even close off blood flow to the heart. The result may be chest pain,  sometimes called "angina," or a heart attack. Because calcium is a marker of CAD, the amount of calcium detected on a cardiac CT scan is a helpful prognostic tool. The findings on cardiac CT are expressed as a calcium score. Another name for this test is coronary artery calcium scoring.  What are some common uses of the procedure? The goal of cardiac CT scan for calcium scoring is to determine if CAD is present and to what extent, even if there are no symptoms. It is a screening study that may be recommended by a physician for patients with risk factors for CAD but no clinical symptoms. The major risk factors for CAD are: . high blood cholesterol levels  . family history of heart attacks  . diabetes  . high blood pressure  . cigarette smoking  . overweight or obese  . physical inactivity   A negative cardiac CT scan for calcium scoring shows no calcification within the coronary arteries. This suggests that CAD is absent or so minimal it cannot be seen by this technique. The chance of having a heart attack over the next two to five years is very low under these circumstances. A positive test means that CAD is present, regardless of whether or not the patient is experiencing any symptoms. The amount of calcification-expressed as the calcium score-may   help to predict the likelihood of a myocardial infarction (heart attack) in the coming years and helps your medical doctor or cardiologist decide whether the patient may need to take preventive medicine or undertake other measures such as diet and exercise to lower the risk for heart attack. The extent of CAD is graded according to your calcium score:  Calcium Score  Presence of CAD (coronary artery disease)  0 No evidence of CAD   1-10 Minimal evidence of CAD  11-100 Mild evidence of CAD  101-400 Moderate evidence of CAD  Over 400 Extensive evidence of CAD    

## 2019-03-27 NOTE — Assessment & Plan Note (Addendum)
Stress discussed Xanax prn - rare  Potential benefits of a long term benzodiazepines  use as well as potential risks  and complications were explained to the patient and were aknowledged.

## 2019-03-27 NOTE — Assessment & Plan Note (Signed)
We discussed age appropriate health related issues, including available/recomended screening tests and vaccinations. We discussed a need for adhering to healthy diet and exercise. Labs were ordered to be later reviewed . All questions were answered.   

## 2019-03-29 ENCOUNTER — Other Ambulatory Visit (INDEPENDENT_AMBULATORY_CARE_PROVIDER_SITE_OTHER): Payer: BC Managed Care – PPO

## 2019-03-29 DIAGNOSIS — F439 Reaction to severe stress, unspecified: Secondary | ICD-10-CM

## 2019-03-29 DIAGNOSIS — Z0000 Encounter for general adult medical examination without abnormal findings: Secondary | ICD-10-CM | POA: Diagnosis not present

## 2019-03-29 LAB — HEPATIC FUNCTION PANEL
ALT: 17 U/L (ref 0–53)
AST: 18 U/L (ref 0–37)
Albumin: 4.2 g/dL (ref 3.5–5.2)
Alkaline Phosphatase: 114 U/L (ref 39–117)
Bilirubin, Direct: 0.1 mg/dL (ref 0.0–0.3)
Total Bilirubin: 0.7 mg/dL (ref 0.2–1.2)
Total Protein: 6.8 g/dL (ref 6.0–8.3)

## 2019-03-29 LAB — URINALYSIS
Bilirubin Urine: NEGATIVE
Hgb urine dipstick: NEGATIVE
Ketones, ur: NEGATIVE
Leukocytes,Ua: NEGATIVE
Nitrite: NEGATIVE
Specific Gravity, Urine: 1.02 (ref 1.000–1.030)
Total Protein, Urine: NEGATIVE
Urine Glucose: NEGATIVE
Urobilinogen, UA: 0.2 (ref 0.0–1.0)
pH: 6 (ref 5.0–8.0)

## 2019-03-29 LAB — CBC WITH DIFFERENTIAL/PLATELET
Basophils Absolute: 0 K/uL (ref 0.0–0.1)
Basophils Relative: 0.6 % (ref 0.0–3.0)
Eosinophils Absolute: 0.2 K/uL (ref 0.0–0.7)
Eosinophils Relative: 2.4 % (ref 0.0–5.0)
HCT: 47.9 % (ref 39.0–52.0)
Hemoglobin: 16.2 g/dL (ref 13.0–17.0)
Lymphocytes Relative: 40 % (ref 12.0–46.0)
Lymphs Abs: 3 K/uL (ref 0.7–4.0)
MCHC: 33.8 g/dL (ref 30.0–36.0)
MCV: 91 fl (ref 78.0–100.0)
Monocytes Absolute: 0.6 K/uL (ref 0.1–1.0)
Monocytes Relative: 7.7 % (ref 3.0–12.0)
Neutro Abs: 3.7 K/uL (ref 1.4–7.7)
Neutrophils Relative %: 49.3 % (ref 43.0–77.0)
Platelets: 246 K/uL (ref 150.0–400.0)
RBC: 5.26 Mil/uL (ref 4.22–5.81)
RDW: 12.9 % (ref 11.5–15.5)
WBC: 7.5 K/uL (ref 4.0–10.5)

## 2019-03-29 LAB — LIPID PANEL
Cholesterol: 190 mg/dL (ref 0–200)
HDL: 41.1 mg/dL
LDL Cholesterol: 123 mg/dL — ABNORMAL HIGH (ref 0–99)
NonHDL: 148.71
Total CHOL/HDL Ratio: 5
Triglycerides: 131 mg/dL (ref 0.0–149.0)
VLDL: 26.2 mg/dL (ref 0.0–40.0)

## 2019-03-29 LAB — BASIC METABOLIC PANEL
BUN: 12 mg/dL (ref 6–23)
CO2: 31 mEq/L (ref 19–32)
Calcium: 9.4 mg/dL (ref 8.4–10.5)
Chloride: 102 mEq/L (ref 96–112)
Creatinine, Ser: 1.06 mg/dL (ref 0.40–1.50)
GFR: 70.04 mL/min (ref >60.00)
Glucose, Bld: 98 mg/dL (ref 70–99)
Potassium: 4.5 mEq/L (ref 3.5–5.1)
Sodium: 141 mEq/L (ref 135–145)

## 2019-03-29 LAB — TESTOSTERONE: Testosterone: 391.55 ng/dL (ref 300.00–890.00)

## 2019-03-29 LAB — PSA: PSA: 3.34 ng/mL (ref 0.10–4.00)

## 2019-03-29 LAB — TSH: TSH: 0.74 u[IU]/mL (ref 0.35–4.50)

## 2019-03-29 LAB — VITAMIN D 25 HYDROXY (VIT D DEFICIENCY, FRACTURES): VITD: 21.23 ng/mL — ABNORMAL LOW (ref 30.00–100.00)

## 2019-03-31 ENCOUNTER — Other Ambulatory Visit: Payer: Self-pay | Admitting: Internal Medicine

## 2019-03-31 MED ORDER — VITAMIN D3 1.25 MG (50000 UT) PO CAPS: 1.0000 | ORAL_CAPSULE | ORAL | 0 refills | Status: DC

## 2019-03-31 MED ORDER — VITAMIN D3 50 MCG (2000 UT) PO CAPS: 2000.0000 [IU] | ORAL_CAPSULE | Freq: Every day | ORAL | 3 refills | Status: DC

## 2019-04-01 NOTE — Progress Notes (Signed)
Medical screening examination/treatment/procedure(s) were performed by non-physician practitioner and as supervising physician I was immediately available for consultation/collaboration. I agree with above. Areli Jowett, MD  

## 2019-04-05 ENCOUNTER — Other Ambulatory Visit: Payer: Self-pay | Admitting: Internal Medicine

## 2019-04-13 ENCOUNTER — Ambulatory Visit (INDEPENDENT_AMBULATORY_CARE_PROVIDER_SITE_OTHER): Payer: BC Managed Care – PPO

## 2019-04-13 ENCOUNTER — Other Ambulatory Visit: Payer: Self-pay

## 2019-04-13 ENCOUNTER — Ambulatory Visit: Payer: BC Managed Care – PPO

## 2019-04-13 DIAGNOSIS — E538 Deficiency of other specified B group vitamins: Secondary | ICD-10-CM

## 2019-04-13 MED ORDER — CYANOCOBALAMIN 1000 MCG/ML IJ SOLN
1000.0000 ug | Freq: Once | INTRAMUSCULAR | Status: AC
  Administered 2019-04-13: 16:00:00 1000 ug via INTRAMUSCULAR

## 2019-04-17 ENCOUNTER — Telehealth: Payer: Self-pay | Admitting: Internal Medicine

## 2019-04-17 NOTE — Telephone Encounter (Signed)
It would be best if he makes an office appointment to discuss the pros and cons with me. Thanks

## 2019-04-17 NOTE — Telephone Encounter (Signed)
Please advise 

## 2019-04-17 NOTE — Telephone Encounter (Signed)
Pt requested approval to dc omperazole.  He believes that it is a high dose and reported that he has "a problem with Vitamin D absorption, and omeprazole could be affecting it."

## 2019-04-18 NOTE — Telephone Encounter (Signed)
Sent mychart message to patient regarding Dr. Blanch Media suggestion

## 2019-04-24 NOTE — Progress Notes (Signed)
Medical screening examination/treatment/procedure(s) were performed by non-physician practitioner and as supervising physician I was immediately available for consultation/collaboration. I agree with above. Blake Goya, MD  

## 2019-05-15 ENCOUNTER — Other Ambulatory Visit: Payer: Self-pay | Admitting: Internal Medicine

## 2019-05-17 ENCOUNTER — Ambulatory Visit: Payer: BC Managed Care – PPO

## 2019-05-18 ENCOUNTER — Other Ambulatory Visit: Payer: Self-pay

## 2019-05-18 ENCOUNTER — Ambulatory Visit (INDEPENDENT_AMBULATORY_CARE_PROVIDER_SITE_OTHER): Payer: BC Managed Care – PPO

## 2019-05-18 DIAGNOSIS — E538 Deficiency of other specified B group vitamins: Secondary | ICD-10-CM | POA: Diagnosis not present

## 2019-05-18 MED ORDER — CYANOCOBALAMIN 1000 MCG/ML IJ SOLN
1000.0000 ug | Freq: Once | INTRAMUSCULAR | Status: AC
  Administered 2019-05-18: 1000 ug via INTRAMUSCULAR

## 2019-05-27 NOTE — Progress Notes (Signed)
Medical screening examination/treatment/procedure(s) were performed by non-physician practitioner and as supervising physician I was immediately available for consultation/collaboration. I agree with above. Roger Kettles, MD  

## 2019-06-19 ENCOUNTER — Ambulatory Visit: Payer: BC Managed Care – PPO

## 2019-06-20 ENCOUNTER — Ambulatory Visit (INDEPENDENT_AMBULATORY_CARE_PROVIDER_SITE_OTHER): Payer: BC Managed Care – PPO

## 2019-06-20 ENCOUNTER — Other Ambulatory Visit: Payer: Self-pay

## 2019-06-20 DIAGNOSIS — E538 Deficiency of other specified B group vitamins: Secondary | ICD-10-CM

## 2019-06-20 MED ORDER — CYANOCOBALAMIN 1000 MCG/ML IJ SOLN
1000.0000 ug | Freq: Once | INTRAMUSCULAR | Status: AC
  Administered 2019-06-20: 16:00:00 1000 ug via INTRAMUSCULAR

## 2019-07-12 NOTE — Progress Notes (Signed)
Medical screening examination/treatment/procedure(s) were performed by non-physician practitioner and as supervising physician I was immediately available for consultation/collaboration. I agree with above. Jeevan Kalla, MD  

## 2019-07-20 ENCOUNTER — Other Ambulatory Visit: Payer: Self-pay | Admitting: Internal Medicine

## 2019-07-20 ENCOUNTER — Telehealth: Payer: Self-pay | Admitting: Internal Medicine

## 2019-07-20 ENCOUNTER — Ambulatory Visit: Payer: BC Managed Care – PPO

## 2019-07-20 MED ORDER — OMEPRAZOLE 40 MG PO CPDR: DELAYED_RELEASE_CAPSULE | ORAL | 0 refills | Status: DC

## 2019-07-20 NOTE — Telephone Encounter (Signed)
Omerpazole refilled

## 2019-07-20 NOTE — Telephone Encounter (Signed)
Pt just made an appt with Dr. Henrene Pastor for 3/11 at 3:20pm. He is [facility] [address]

## 2019-07-26 ENCOUNTER — Ambulatory Visit (INDEPENDENT_AMBULATORY_CARE_PROVIDER_SITE_OTHER): Payer: BC Managed Care – PPO | Admitting: *Deleted

## 2019-07-26 ENCOUNTER — Other Ambulatory Visit: Payer: Self-pay

## 2019-07-26 DIAGNOSIS — E538 Deficiency of other specified B group vitamins: Secondary | ICD-10-CM

## 2019-07-26 MED ORDER — CYANOCOBALAMIN 1000 MCG/ML IJ SOLN
1000.0000 ug | Freq: Once | INTRAMUSCULAR | Status: AC
  Administered 2019-07-26: 15:00:00 1000 ug via INTRAMUSCULAR

## 2019-08-24 ENCOUNTER — Ambulatory Visit: Payer: BC Managed Care – PPO

## 2019-08-30 ENCOUNTER — Other Ambulatory Visit: Payer: Self-pay

## 2019-08-30 ENCOUNTER — Ambulatory Visit (INDEPENDENT_AMBULATORY_CARE_PROVIDER_SITE_OTHER): Payer: BC Managed Care – PPO

## 2019-08-30 DIAGNOSIS — E538 Deficiency of other specified B group vitamins: Secondary | ICD-10-CM

## 2019-08-30 MED ORDER — CYANOCOBALAMIN 1000 MCG/ML IJ SOLN
1000.0000 ug | Freq: Once | INTRAMUSCULAR | Status: AC
  Administered 2019-08-30: 16:00:00 1000 ug via INTRAMUSCULAR

## 2019-09-03 ENCOUNTER — Other Ambulatory Visit: Payer: Self-pay

## 2019-09-03 ENCOUNTER — Ambulatory Visit: Payer: BC Managed Care – PPO | Attending: Internal Medicine

## 2019-09-03 DIAGNOSIS — Z23 Encounter for immunization: Secondary | ICD-10-CM | POA: Insufficient documentation

## 2019-09-03 NOTE — Progress Notes (Signed)
   Covid-19 Vaccination Clinic  Name:  AUBIE PRESHA    MRN: [id number] DOB: [birth year]  09/03/2019  Mr. Elliff was observed post Covid-19 immunization for 15 minutes without incident. He was provided with Vaccine Information Sheet and instruction to access the V-Safe system.   Mr. Beets was instructed to call 911 with any severe reactions post vaccine: Marland Kitchen Difficulty breathing  . Swelling of face and throat  . A fast heartbeat  . A bad rash all over body  . Dizziness and weakness   Immunizations Administered    Name Date Dose VIS Date Route   Pfizer COVID-19 Vaccine 09/03/2019  2:14 PM 0.3 mL 06/09/2019 Intramuscular   Manufacturer: [place]   Lot: [id number]   [place]: [id number]

## 2019-09-07 ENCOUNTER — Ambulatory Visit: Payer: BC Managed Care – PPO | Admitting: Internal Medicine

## 2019-09-10 NOTE — Progress Notes (Signed)
Medical screening examination/treatment/procedure(s) were performed by non-physician practitioner and as supervising physician I was immediately available for consultation/collaboration. I agree with above. Zael Shuman, MD  

## 2019-10-03 ENCOUNTER — Ambulatory Visit: Payer: BC Managed Care – PPO | Attending: Internal Medicine

## 2019-10-03 DIAGNOSIS — Z23 Encounter for immunization: Secondary | ICD-10-CM

## 2019-10-03 NOTE — Progress Notes (Signed)
   Covid-19 Vaccination Clinic  Name:  William Acosta    MRN: [id number] DOB: 02-Oct-1955  10/03/2019  Mr. William Acosta was observed post Covid-19 immunization for 15 minutes without incident. He was provided with Vaccine Information Sheet and instruction to access the V-Safe system.   Mr. William Acosta was instructed to call 911 with any severe reactions post vaccine: Marland Kitchen Difficulty breathing  . Swelling of face and throat  . A fast heartbeat  . A bad rash all over body  . Dizziness and weakness   Immunizations Administered    Name Date Dose VIS Date Route   Pfizer COVID-19 Vaccine 10/03/2019  3:10 PM 0.3 mL 06/09/2019 Intramuscular   Manufacturer: [place]   Lot: [id number]   [place]: [id number]

## 2019-10-17 ENCOUNTER — Ambulatory Visit: Payer: BC Managed Care – PPO | Admitting: Internal Medicine

## 2019-10-17 ENCOUNTER — Other Ambulatory Visit: Payer: Self-pay | Admitting: Internal Medicine

## 2019-10-17 MED ORDER — OMEPRAZOLE 40 MG PO CPDR: DELAYED_RELEASE_CAPSULE | ORAL | 0 refills | Status: DC

## 2019-10-17 NOTE — Telephone Encounter (Signed)
Sent script to pharmacy for 30 day supply only. Left message informing patient script was sent to pharmacy.

## 2019-11-08 ENCOUNTER — Other Ambulatory Visit: Payer: Self-pay

## 2019-11-08 ENCOUNTER — Ambulatory Visit (INDEPENDENT_AMBULATORY_CARE_PROVIDER_SITE_OTHER): Payer: BC Managed Care – PPO | Admitting: *Deleted

## 2019-11-08 DIAGNOSIS — E538 Deficiency of other specified B group vitamins: Secondary | ICD-10-CM

## 2019-11-08 MED ORDER — CYANOCOBALAMIN 1000 MCG/ML IJ SOLN
1000.0000 ug | Freq: Once | INTRAMUSCULAR | Status: AC
  Administered 2019-11-08: 1000 ug via INTRAMUSCULAR

## 2019-11-08 NOTE — Progress Notes (Signed)
Pls cosign for B12 inj../lmb  

## 2019-11-11 ENCOUNTER — Other Ambulatory Visit: Payer: Self-pay | Admitting: Internal Medicine

## 2019-11-15 ENCOUNTER — Telehealth: Payer: Self-pay | Admitting: Internal Medicine

## 2019-11-15 ENCOUNTER — Ambulatory Visit: Payer: BC Managed Care – PPO | Admitting: Internal Medicine

## 2019-11-15 MED ORDER — OMEPRAZOLE 40 MG PO CPDR: DELAYED_RELEASE_CAPSULE | ORAL | 1 refills | Status: DC

## 2019-11-15 NOTE — Telephone Encounter (Signed)
Refilled Omeprazole.  Must keep upcoming appointment for further refills

## 2019-12-06 ENCOUNTER — Ambulatory Visit (INDEPENDENT_AMBULATORY_CARE_PROVIDER_SITE_OTHER): Payer: BC Managed Care – PPO | Admitting: *Deleted

## 2019-12-06 ENCOUNTER — Other Ambulatory Visit: Payer: Self-pay

## 2019-12-06 DIAGNOSIS — E538 Deficiency of other specified B group vitamins: Secondary | ICD-10-CM | POA: Diagnosis not present

## 2019-12-06 MED ORDER — CYANOCOBALAMIN 1000 MCG/ML IJ SOLN
1000.0000 ug | Freq: Once | INTRAMUSCULAR | Status: AC
  Administered 2019-12-06: 1000 ug via INTRAMUSCULAR

## 2019-12-06 NOTE — Progress Notes (Addendum)
Pls cosign for B12 inj../lmb   Medical screening examination/treatment/procedure(s) were performed by non-physician practitioner and as supervising physician I was immediately available for consultation/collaboration.  I agree with above. Aleksei Plotnikov, MD  

## 2019-12-11 ENCOUNTER — Telehealth: Payer: Self-pay | Admitting: Internal Medicine

## 2019-12-11 ENCOUNTER — Other Ambulatory Visit: Payer: Self-pay | Admitting: Internal Medicine

## 2019-12-11 MED ORDER — OMEPRAZOLE 40 MG PO CPDR: DELAYED_RELEASE_CAPSULE | ORAL | 2 refills | Status: DC

## 2019-12-11 NOTE — Telephone Encounter (Signed)
Pt called stating that his pharmacy told him that we declined rf for Omeprazole because he needs an ov first. Pt is already scheduled with Dr. Henrene Pastor on 7/29 and is aware that he needs to keep this appt for further rf but is out of med and is requesting if it can be rf. Pls contact his pharmacy if this is possible.

## 2019-12-11 NOTE — Telephone Encounter (Signed)
Refilled Omeprazole 

## 2020-01-02 ENCOUNTER — Other Ambulatory Visit: Payer: Self-pay

## 2020-01-02 ENCOUNTER — Ambulatory Visit (INDEPENDENT_AMBULATORY_CARE_PROVIDER_SITE_OTHER): Payer: BC Managed Care – PPO | Admitting: Internal Medicine

## 2020-01-02 ENCOUNTER — Encounter: Payer: Self-pay | Admitting: Internal Medicine

## 2020-01-02 VITALS — BP 140/80 | HR 99 | Temp 98.6°F | Ht 67.0 in | Wt 218.0 lb

## 2020-01-02 DIAGNOSIS — W5503XA Scratched by cat, initial encounter: Secondary | ICD-10-CM

## 2020-01-02 DIAGNOSIS — I10 Essential (primary) hypertension: Secondary | ICD-10-CM

## 2020-01-02 DIAGNOSIS — J309 Allergic rhinitis, unspecified: Secondary | ICD-10-CM | POA: Diagnosis not present

## 2020-01-02 DIAGNOSIS — R030 Elevated blood-pressure reading, without diagnosis of hypertension: Secondary | ICD-10-CM

## 2020-01-02 MED ORDER — AMOXICILLIN-POT CLAVULANATE 875-125 MG PO TABS
1.0000 | ORAL_TABLET | Freq: Two times a day (BID) | ORAL | 0 refills | Status: DC
Start: 2020-01-02 — End: 2020-01-25

## 2020-01-02 NOTE — Progress Notes (Signed)
Subjective:    Patient ID: William Acosta, male    DOB: [birth year], 64 y.o.   MRN: [id number]  HPI  Here after unfortuantely episode of feeding a feral cat with a scratch to the right knee, actually 2 small puncture wounds, immedicately cleaned with soap and water and peroxide after a few drops of blood noted, all this am.  Denies fever, chills., worsening redness, swelling or drainage, and has good energy.  Was just that he is leaving for [facility] tomorrow.  Also, Does have several wks ongoing nasal allergy symptoms with clearish congestion, itch and sneezing, without fever, pain, ST, cough, swelling or wheezing. Pt denies chest pain, increased sob or doe, wheezing, orthopnea, PND, increased LE swelling, palpitations, dizziness or syncope.  Pt denies new neurological symptoms such as new headache, or facial or extremity weakness or numbness   Pt denies polydipsia, polyuria Wt is up recently during covid with less active Wt Readings from Last 3 Encounters:  01/02/20 218 lb (98.9 kg)  03/27/19 214 lb (97.1 kg)  06/09/18 210 lb (95.3 kg)   Past Medical History:  Diagnosis Date   Allergic rhinitis    Allergy    Arthritis    Cancer ([place])    skin melanoma on right leg 2010   Depression    Diverticulosis    GERD (gastroesophageal reflux disease)    Hyperlipidemia    Hypertension    Multinodular goiter    Past Surgical History:  Procedure Laterality Date   SALIVARY GLAND SURGERY     Right    reports that he quit smoking about 34 years ago. His smoking use included cigarettes. He has never used smokeless tobacco. He reports that he does not drink alcohol and does not use drugs. family history includes Breast cancer in his maternal grandmother; Diabetes in an other family member; Heart attack in his father; Heart disease in his father and unknown relative; Hypertension in his mother; Pancreatic cancer in his maternal grandmother. Allergies  Allergen Reactions   Moxifloxacin       REACTION: felt bad   Current Outpatient Medications on File Prior to Visit  Medication Sig Dispense Refill   ALPRAZolam (XANAX) 0.25 MG tablet Take 1 tablet (0.25 mg total) by mouth 2 (two) times daily as needed for anxiety. 60 tablet 0   Cholecalciferol (VITAMIN D3) 50 MCG (2000 UT) capsule Take 1 capsule (2,000 Units total) by mouth daily. 100 capsule 3   cyanocobalamin (,VITAMIN B-12,) 1000 MCG/ML injection Inject 1,000 mcg into the muscle every 30 (thirty) days.     D3-50 1.25 MG (50000 UT) capsule TAKE 1 CAPSULE BY MOUTH ONE TIME PER WEEK 6 capsule 0   metroNIDAZOLE (FLAGYL) 250 MG tablet Take 1 tablet (250 mg total) by mouth 4 (four) times daily. 56 tablet 0   omeprazole (PRILOSEC) 40 MG capsule TAKE 1 CAPSULE BY MOUTH EVERY DAY 30 capsule 2   telmisartan (MICARDIS) 80 MG tablet Take 1 tablet (80 mg total) by mouth daily. Must keep appointment to get refills 90 tablet 3   triamcinolone ointment (KENALOG) 0.5 % Apply 1 application topically 2 (two) times daily. 30 g 0   Current Facility-Administered Medications on File Prior to Visit  Medication Dose Route Frequency Provider Last Rate Last Admin   0.9 %  sodium chloride infusion  500 mL Intravenous Once Irene Shipper, MD      ' Current Outpatient Medications on File Prior to Visit  Medication Sig Dispense Refill  ALPRAZolam (XANAX) 0.25 MG tablet Take 1 tablet (0.25 mg total) by mouth 2 (two) times daily as needed for anxiety. 60 tablet 0   Cholecalciferol (VITAMIN D3) 50 MCG (2000 UT) capsule Take 1 capsule (2,000 Units total) by mouth daily. 100 capsule 3   cyanocobalamin (,VITAMIN B-12,) 1000 MCG/ML injection Inject 1,000 mcg into the muscle every 30 (thirty) days.     D3-50 1.25 MG (50000 UT) capsule TAKE 1 CAPSULE BY MOUTH ONE TIME PER WEEK 6 capsule 0   metroNIDAZOLE (FLAGYL) 250 MG tablet Take 1 tablet (250 mg total) by mouth 4 (four) times daily. 56 tablet 0   omeprazole (PRILOSEC) 40 MG capsule TAKE 1 CAPSULE  BY MOUTH EVERY DAY 30 capsule 2   telmisartan (MICARDIS) 80 MG tablet Take 1 tablet (80 mg total) by mouth daily. Must keep appointment to get refills 90 tablet 3   triamcinolone ointment (KENALOG) 0.5 % Apply 1 application topically 2 (two) times daily. 30 g 0   Current Facility-Administered Medications on File Prior to Visit  Medication Dose Route Frequency Provider Last Rate Last Admin   0.9 %  sodium chloride infusion  500 mL Intravenous Once Irene Shipper, MD       Review of Systems All otherwise neg per pt     Objective:   Physical Exam BP 140/80 (BP Location: Right Arm, Patient Position: Sitting, Cuff Size: Large)    Pulse 99    Temp 98.6 F (37 C) (Oral)    Ht 5\' 7"  (1.702 m)    Wt 218 lb (98.9 kg)    SpO2 97%    BMI 34.14 kg/m  VS noted,  Constitutional: Pt appears in NAD HENT: Head: NCAT.  Right Ear: External ear normal.  Left Ear: External ear normal.  Bilat tm's with mild erythema.  Max sinus areas non tender.  Pharynx with mild erythema, no exudate Eyes: . Pupils are equal, round, and reactive to light. Conjunctivae and EOM are normal Nose: without d/c or deformity Neck: Neck supple. Gross normal ROM Cardiovascular: Normal rate and regular rhythm.   Pulmonary/Chest: Effort normal and breath sounds without rales or wheezing.  Neurological: Pt is alert. At baseline orientation, motor grossly intact Skin: no LE edema, has 2 small very small puncture wounds about 1 cm apart to the right patella, but no red, swelling, tender, red streaks Psychiatric: Pt behavior is normal without agitation , 1+ nervous All otherwise neg per pt Lab Results  Component Value Date   WBC 7.5 03/29/2019   HGB 16.2 03/29/2019   HCT 47.9 03/29/2019   PLT 246.0 03/29/2019   GLUCOSE 98 03/29/2019   CHOL 190 03/29/2019   TRIG 131.0 03/29/2019   HDL 41.10 03/29/2019   LDLCALC 123 (H) 03/29/2019   ALT 17 03/29/2019   AST 18 03/29/2019   NA 141 03/29/2019   K 4.5 03/29/2019   CL 102  03/29/2019   CREATININE 1.06 03/29/2019   BUN 12 03/29/2019   CO2 31 03/29/2019   TSH 0.74 03/29/2019   PSA 3.34 03/29/2019   HGBA1C 5.5 10/29/2017      Assessment & Plan:

## 2020-01-02 NOTE — Assessment & Plan Note (Signed)
Ok for [employer] and nasacort prn,  to f/u any worsening symptoms or concerns

## 2020-01-02 NOTE — Assessment & Plan Note (Signed)
Borderline elevated, likely reactive with anxiety and situational, ok to cont same tx, f/u BP at home and next visit

## 2020-01-02 NOTE — Patient Instructions (Signed)
Please take all new medication as prescribed - the antibiotic only if worsening signs of infection or fever  Ok to take the otc zyrtec and nasacort for allergies  Please continue all other medications as before, and refills have been done if requested.  Please have the pharmacy call with any other refills you may need.  Please continue your efforts at being more active, low cholesterol diet, and weight control.  Please keep your appointments with your specialists as you may have planned

## 2020-01-02 NOTE — Assessment & Plan Note (Addendum)
doesn not appear infected now, no worries about rabies and pt is reassured, ok to follow for worsening s/s infeciton, given rx but should not take unless this occurs  I spent 31 minutes in preparing to see the patient by review of recent labs, imaging and procedures, obtaining and reviewing separately obtained history, communicating with the patient and family or caregiver, ordering medications, tests or procedures, and documenting clinical information in the EHR including the differential Dx, treatment, and any further evaluation and other management of cat scratch, allergies, htn

## 2020-01-09 ENCOUNTER — Other Ambulatory Visit: Payer: Self-pay | Admitting: Internal Medicine

## 2020-01-09 MED ORDER — TELMISARTAN 80 MG PO TABS: 80.0000 mg | ORAL_TABLET | Freq: Every day | ORAL | 0 refills | Status: DC

## 2020-01-09 NOTE — Telephone Encounter (Signed)
   Patient has no medication reamining  1.Medication Requested:telmisartan (MICARDIS) 80 MG tablet  2. Pharmacy (Name[place], [place]):[facility] [place]  3. On Med List: yes  4. Last Visit with PCP:   5. Next visit date with PCP: 01/23/20   Agent: Please be advised that RX refills may take up to 3 business days. We ask that you follow-up with your pharmacy.

## 2020-01-11 ENCOUNTER — Ambulatory Visit (INDEPENDENT_AMBULATORY_CARE_PROVIDER_SITE_OTHER): Payer: BC Managed Care – PPO | Admitting: *Deleted

## 2020-01-11 DIAGNOSIS — E538 Deficiency of other specified B group vitamins: Secondary | ICD-10-CM | POA: Diagnosis not present

## 2020-01-11 MED ORDER — CYANOCOBALAMIN 1000 MCG/ML IJ SOLN
1000.0000 ug | Freq: Once | INTRAMUSCULAR | Status: AC
Start: 2020-01-11 — End: 2020-01-11
  Administered 2020-01-11: 1000 ug via INTRAMUSCULAR

## 2020-01-11 NOTE — Progress Notes (Addendum)
Pls cosign for B12 inj../lmb   Medical screening examination/treatment/procedure(s) were performed by non-physician practitioner and as supervising physician I was immediately available for consultation/collaboration.  I agree with above. Aleksei Plotnikov, MD  

## 2020-01-23 ENCOUNTER — Ambulatory Visit: Payer: BC Managed Care – PPO | Admitting: Internal Medicine

## 2020-01-23 DIAGNOSIS — Z0289 Encounter for other administrative examinations: Secondary | ICD-10-CM

## 2020-01-25 ENCOUNTER — Ambulatory Visit: Payer: BC Managed Care – PPO | Admitting: Internal Medicine

## 2020-01-25 ENCOUNTER — Encounter: Payer: Self-pay | Admitting: Internal Medicine

## 2020-01-25 VITALS — BP 138/78 | HR 80 | Ht 68.0 in | Wt 223.1 lb

## 2020-01-25 DIAGNOSIS — K222 Esophageal obstruction: Secondary | ICD-10-CM | POA: Diagnosis not present

## 2020-01-25 DIAGNOSIS — K219 Gastro-esophageal reflux disease without esophagitis: Secondary | ICD-10-CM | POA: Diagnosis not present

## 2020-01-25 DIAGNOSIS — R1310 Dysphagia, unspecified: Secondary | ICD-10-CM

## 2020-01-25 MED ORDER — OMEPRAZOLE 40 MG PO CPDR: DELAYED_RELEASE_CAPSULE | ORAL | 2 refills | Status: DC

## 2020-01-25 NOTE — Patient Instructions (Signed)
We have sent the following medications to your pharmacy for you to pick up at your convenience:  Omeprazole.  Please follow up in one year  

## 2020-01-25 NOTE — Progress Notes (Signed)
HISTORY OF PRESENT ILLNESS:  William Acosta is a 64 y.o. male, business officer at [employer], who was evaluated in this office March 25, 2018 regarding self-limited diarrheal illness, intermittent solid food dysphagia, and colon cancer screening.  See that dictation.  He subsequently underwent colonoscopy and upper endoscopy May 05, 2018.  Complete colonoscopy revealed diminutive non-adenomatous colon polyp and benign biopsies of the rectum.  Follow-up in 10 years recommended.  Upper endoscopy revealed a distal esophageal stricture and mild esophagitis.  Esophagus was dilated with 54 French Maloney dilator and the patient prescribed omeprazole 40 mg daily.  He was asked to follow-up in the office in 6 to 8 weeks, but follows up at this time.  Patient tells me that he had a self-limited illness about 2 weeks ago with acute bloating nausea with vomiting and diarrhea.  Last 1 day or less.  No recurrence.  Question food poisoning.  Next, he tells me that he has been compliant with his PPI therapy.  No active reflux symptoms.  His dysphagia is significantly improved post dilation.  He inquires about possibly weaning off his PPI therapy.  Review of blood work from September 2020 shows unremarkable comprehensive metabolic panel and CBC with hemoglobin 16.2.  He has completed his Covid vaccination series.  Review of systems is otherwise negative  REVIEW OF SYSTEMS:  All non-GI ROS negative unless otherwise stated in the HPI except for allergies, arthritis  Past Medical History:  Diagnosis Date   Allergic rhinitis    Allergy    Arthritis    Cancer ([place])    skin melanoma on right leg 2010   Depression    Diverticulosis    GERD (gastroesophageal reflux disease)    Hyperlipidemia    Hypertension    Multinodular goiter     Past Surgical History:  Procedure Laterality Date   SALIVARY GLAND SURGERY     Right    Social History William Acosta  reports that he quit smoking about 34 years  ago. His smoking use included cigarettes. He has never used smokeless tobacco. He reports current alcohol use. He reports that he does not use drugs.  family history includes Breast cancer in his maternal grandmother; Diabetes in an other family member; Heart attack in his father; Heart disease in his father and another family member; Hypertension in his mother; Pancreatic cancer in his maternal grandmother.  Allergies  Allergen Reactions   Moxifloxacin     REACTION: felt bad       PHYSICAL EXAMINATION: Vital signs: BP (!) 138/78    Pulse 80    Ht 5\' 8"  (1.727 m) Comment: with shoes   Wt (!) 223 lb 2 oz (101.2 kg)    BMI 33.93 kg/m   Constitutional: generally well-appearing, no acute distress Psychiatric: alert and oriented x3, cooperative Eyes:  conjunctiva pink Abdomen: Not examined Skin: no lesions on visible extremities Neuro: Gross deficit  ASSESSMENT:  1.  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  Asymptomatic post dilation (November 2019) on PPI 2.  Recent self-limited illness manifested by nausea vomiting bloating and diarrhea.  Possible food poisoning 3.  Colonoscopy 2019 with non-adenomatous polyps   PLAN:  1.  Reflux precautions 2.  We discussed attempts at weaning PPI therapy.  The risks would be recurrent reflux symptoms and recurrent esophageal stricturing and dysphagia.  However, he could resume medical therapy if symptoms were to recur. 3.  Encouraged to contact the office for any questions or problems 4.  Otherwise,  office follow-up 1 year 5.  Screening colonoscopy 2029 Total 20 minutes was spent preparing to see the patient, reviewing test, obtaining interval history, performing medically appropriate exam, counseling the patient regarding his above listed issues, prescribing medication and discussing its risks, and documenting clinical information in the health record

## 2020-01-27 ENCOUNTER — Other Ambulatory Visit: Payer: Self-pay | Admitting: Internal Medicine

## 2020-02-14 ENCOUNTER — Ambulatory Visit (INDEPENDENT_AMBULATORY_CARE_PROVIDER_SITE_OTHER): Payer: BC Managed Care – PPO | Admitting: *Deleted

## 2020-02-14 ENCOUNTER — Other Ambulatory Visit: Payer: Self-pay

## 2020-02-14 DIAGNOSIS — E538 Deficiency of other specified B group vitamins: Secondary | ICD-10-CM | POA: Diagnosis not present

## 2020-02-14 MED ORDER — CYANOCOBALAMIN 1000 MCG/ML IJ SOLN
1000.0000 ug | Freq: Once | INTRAMUSCULAR | Status: AC
  Administered 2020-02-14: 1000 ug via INTRAMUSCULAR

## 2020-02-14 NOTE — Progress Notes (Addendum)
Pls cosign for B12 inj../lmb   Medical screening examination/treatment/procedure(s) were performed by non-physician practitioner and as supervising physician I was immediately available for consultation/collaboration.  I agree with above. Aleksei Plotnikov, MD  

## 2020-03-18 ENCOUNTER — Ambulatory Visit: Payer: BC Managed Care – PPO

## 2020-04-04 ENCOUNTER — Telehealth: Payer: Self-pay | Admitting: Internal Medicine

## 2020-04-04 NOTE — Telephone Encounter (Signed)
Patient called and he said that he is being charged a no show fee for 01/23/2020 and he states that he called the office to cancel the appointment. He was wanting a call back in regards to this.   He can be reached at [phone]

## 2020-04-04 NOTE — Telephone Encounter (Signed)
Patient called and is requesting a medication refill for telmisartan (MICARDIS) 80 MG tablet  He states that he is almost out and his next OV is 04/25/2020. [facility] [place] [place]

## 2020-04-05 MED ORDER — TELMISARTAN 80 MG PO TABS: 80.0000 mg | ORAL_TABLET | Freq: Every day | ORAL | 3 refills | Status: DC

## 2020-04-05 NOTE — Telephone Encounter (Signed)
Okay.  Thanks.

## 2020-04-17 ENCOUNTER — Ambulatory Visit (INDEPENDENT_AMBULATORY_CARE_PROVIDER_SITE_OTHER): Payer: BC Managed Care – PPO | Admitting: *Deleted

## 2020-04-17 ENCOUNTER — Other Ambulatory Visit: Payer: Self-pay

## 2020-04-17 DIAGNOSIS — E538 Deficiency of other specified B group vitamins: Secondary | ICD-10-CM | POA: Diagnosis not present

## 2020-04-17 MED ORDER — CYANOCOBALAMIN 1000 MCG/ML IJ SOLN
1000.0000 ug | Freq: Once | INTRAMUSCULAR | Status: AC
  Administered 2020-04-17: 1000 ug via INTRAMUSCULAR

## 2020-04-17 NOTE — Progress Notes (Addendum)
Pls cosign for B12 inj../lmb   Medical screening examination/treatment/procedure(s) were performed by non-physician practitioner and as supervising physician I was immediately available for consultation/collaboration.  I agree with above. Aleksei Plotnikov, MD  

## 2020-04-25 ENCOUNTER — Ambulatory Visit: Payer: BC Managed Care – PPO | Admitting: Internal Medicine

## 2020-04-25 ENCOUNTER — Encounter: Payer: Self-pay | Admitting: Internal Medicine

## 2020-04-25 ENCOUNTER — Other Ambulatory Visit: Payer: Self-pay

## 2020-04-25 DIAGNOSIS — E538 Deficiency of other specified B group vitamins: Secondary | ICD-10-CM | POA: Diagnosis not present

## 2020-04-25 DIAGNOSIS — Z23 Encounter for immunization: Secondary | ICD-10-CM | POA: Diagnosis not present

## 2020-04-25 DIAGNOSIS — Z0000 Encounter for general adult medical examination without abnormal findings: Secondary | ICD-10-CM

## 2020-04-25 MED ORDER — PHENTERMINE HCL 37.5 MG PO TABS
37.5000 mg | ORAL_TABLET | Freq: Every day | ORAL | 2 refills | Status: DC
Start: 2020-04-25 — End: 2020-07-31

## 2020-04-25 NOTE — Progress Notes (Signed)
Subjective:  Patient ID: William Acosta, male    DOB: 16-May-1956  Age: 64 y.o. MRN: [id number]  CC: Annual Exam   HPI William Acosta presents for a well exam  Outpatient Medications Prior to Visit  Medication Sig Dispense Refill  . cyanocobalamin (,VITAMIN B-12,) 1000 MCG/ML injection Inject 1,000 mcg into the muscle every 30 (thirty) days.    Marland Kitchen telmisartan (MICARDIS) 80 MG tablet Take 1 tablet (80 mg total) by mouth daily. Must keep appointment to get refills 90 tablet 3  . ALPRAZolam (XANAX) 0.25 MG tablet Take 1 tablet (0.25 mg total) by mouth 2 (two) times daily as needed for anxiety. (Patient not taking: Reported on 01/25/2020) 60 tablet 0  . omeprazole (PRILOSEC) 40 MG capsule TAKE 1 CAPSULE BY MOUTH EVERY DAY (Patient not taking: Reported on 04/25/2020) 30 capsule 2  . triamcinolone ointment (KENALOG) 0.5 % Apply 1 application topically 2 (two) times daily. (Patient not taking: Reported on 01/25/2020) 30 g 0  . 0.9 %  sodium chloride infusion      No facility-administered medications prior to visit.    ROS: Review of Systems  Constitutional: Negative for appetite change, fatigue and unexpected weight change.  HENT: Negative for congestion, nosebleeds, sneezing, sore throat and trouble swallowing.   Eyes: Negative for itching and visual disturbance.  Respiratory: Negative for cough.   Cardiovascular: Negative for chest pain, palpitations and leg swelling.  Gastrointestinal: Negative for abdominal distention, blood in stool, diarrhea and nausea.  Genitourinary: Negative for frequency and hematuria.  Musculoskeletal: Negative for back pain, gait problem, joint swelling and neck pain.  Skin: Negative for rash.  Neurological: Negative for dizziness, tremors, speech difficulty and weakness.  Psychiatric/Behavioral: Negative for agitation, dysphoric mood and sleep disturbance. The patient is not nervous/anxious.     Objective:  BP 122/84 (BP Location: Left Arm)   Pulse 79    Temp 98.4 F (36.9 C) (Oral)   Wt 220 lb 12.8 oz (100.2 kg)   SpO2 95%   BMI 33.57 kg/m   BP Readings from Last 3 Encounters:  04/25/20 122/84  01/25/20 (!) 138/78  01/02/20 140/80    Wt Readings from Last 3 Encounters:  04/25/20 220 lb 12.8 oz (100.2 kg)  01/25/20 (!) 223 lb 2 oz (101.2 kg)  01/02/20 218 lb (98.9 kg)    Physical Exam Constitutional:      General: He is not in acute distress.    Appearance: He is well-developed. He is obese.     Comments: NAD  Eyes:     Conjunctiva/sclera: Conjunctivae normal.     Pupils: Pupils are equal, round, and reactive to light.  Neck:     Thyroid: No thyromegaly.     Vascular: No JVD.  Cardiovascular:     Rate and Rhythm: Normal rate and regular rhythm.     Heart sounds: Normal heart sounds. No murmur heard.  No friction rub. No gallop.   Pulmonary:     Effort: Pulmonary effort is normal. No respiratory distress.     Breath sounds: Normal breath sounds. No wheezing or rales.  Chest:     Chest wall: No tenderness.  Abdominal:     General: Bowel sounds are normal. There is no distension.     Palpations: Abdomen is soft. There is no mass.     Tenderness: There is no abdominal tenderness. There is no guarding or rebound.  Musculoskeletal:        General: No tenderness. Normal range of motion.  Cervical back: Normal range of motion.  Lymphadenopathy:     Cervical: No cervical adenopathy.  Skin:    General: Skin is warm and dry.     Findings: No rash.  Neurological:     Mental Status: He is alert and oriented to person, place, and time.     Cranial Nerves: No cranial nerve deficit.     Motor: No abnormal muscle tone.     Coordination: Coordination normal.     Gait: Gait normal.     Deep Tendon Reflexes: Reflexes are normal and symmetric.  Psychiatric:        Behavior: Behavior normal.        Thought Content: Thought content normal.        Judgment: Judgment normal.   Rectal - pt declined  Lab Results  Component  Value Date   WBC 7.5 03/29/2019   HGB 16.2 03/29/2019   HCT 47.9 03/29/2019   PLT 246.0 03/29/2019   GLUCOSE 98 03/29/2019   CHOL 190 03/29/2019   TRIG 131.0 03/29/2019   HDL 41.10 03/29/2019   LDLCALC 123 (H) 03/29/2019   ALT 17 03/29/2019   AST 18 03/29/2019   NA 141 03/29/2019   K 4.5 03/29/2019   CL 102 03/29/2019   CREATININE 1.06 03/29/2019   BUN 12 03/29/2019   CO2 31 03/29/2019   TSH 0.74 03/29/2019   PSA 3.34 03/29/2019   HGBA1C 5.5 10/29/2017    No results found.  Assessment & Plan:      Walker Kehr, MD

## 2020-04-25 NOTE — Progress Notes (Signed)
Subjective:  Patient ID: William Acosta, male    DOB: 07-12-1955  Age: 64 y.o. MRN: [id number]  CC: Annual Exam   HPI Rue Tinnel Spellman presents for a well exam  Outpatient Medications Prior to Visit  Medication Sig Dispense Refill  . cyanocobalamin (,VITAMIN B-12,) 1000 MCG/ML injection Inject 1,000 mcg into the muscle every 30 (thirty) days.    Marland Kitchen telmisartan (MICARDIS) 80 MG tablet Take 1 tablet (80 mg total) by mouth daily. Must keep appointment to get refills 90 tablet 3  . ALPRAZolam (XANAX) 0.25 MG tablet Take 1 tablet (0.25 mg total) by mouth 2 (two) times daily as needed for anxiety. (Patient not taking: Reported on 01/25/2020) 60 tablet 0  . omeprazole (PRILOSEC) 40 MG capsule TAKE 1 CAPSULE BY MOUTH EVERY DAY (Patient not taking: Reported on 04/25/2020) 30 capsule 2  . triamcinolone ointment (KENALOG) 0.5 % Apply 1 application topically 2 (two) times daily. (Patient not taking: Reported on 01/25/2020) 30 g 0  . 0.9 %  sodium chloride infusion      No facility-administered medications prior to visit.    ROS: Review of Systems  Constitutional: Negative for appetite change, fatigue and unexpected weight change.  HENT: Negative for congestion, nosebleeds, sneezing, sore throat and trouble swallowing.   Eyes: Negative for itching and visual disturbance.  Respiratory: Negative for cough.   Cardiovascular: Negative for chest pain, palpitations and leg swelling.  Gastrointestinal: Negative for abdominal distention, blood in stool, diarrhea and nausea.  Genitourinary: Negative for frequency and hematuria.  Musculoskeletal: Negative for back pain, gait problem, joint swelling and neck pain.  Skin: Negative for rash.  Neurological: Negative for dizziness, tremors, speech difficulty and weakness.  Psychiatric/Behavioral: Negative for agitation, dysphoric mood and sleep disturbance. The patient is not nervous/anxious.     Objective:  BP 122/84 (BP Location: Left Arm)   Pulse 79    Temp 98.4 F (36.9 C) (Oral)   Wt 220 lb 12.8 oz (100.2 kg)   SpO2 95%   BMI 33.57 kg/m   BP Readings from Last 3 Encounters:  04/25/20 122/84  01/25/20 (!) 138/78  01/02/20 140/80    Wt Readings from Last 3 Encounters:  04/25/20 220 lb 12.8 oz (100.2 kg)  01/25/20 (!) 223 lb 2 oz (101.2 kg)  01/02/20 218 lb (98.9 kg)    Physical Exam Constitutional:      General: He is not in acute distress.    Appearance: He is well-developed.     Comments: NAD  Eyes:     Conjunctiva/sclera: Conjunctivae normal.     Pupils: Pupils are equal, round, and reactive to light.  Neck:     Thyroid: No thyromegaly.     Vascular: No JVD.  Cardiovascular:     Rate and Rhythm: Normal rate and regular rhythm.     Heart sounds: Normal heart sounds. No murmur heard.  No friction rub. No gallop.   Pulmonary:     Effort: Pulmonary effort is normal. No respiratory distress.     Breath sounds: Normal breath sounds. No wheezing or rales.  Chest:     Chest wall: No tenderness.  Abdominal:     General: Bowel sounds are normal. There is no distension.     Palpations: Abdomen is soft. There is no mass.     Tenderness: There is no abdominal tenderness. There is no guarding or rebound.  Musculoskeletal:        General: No tenderness. Normal range of motion.  Cervical back: Normal range of motion.  Lymphadenopathy:     Cervical: No cervical adenopathy.  Skin:    General: Skin is warm and dry.     Findings: No rash.  Neurological:     Mental Status: He is alert and oriented to person, place, and time.     Cranial Nerves: No cranial nerve deficit.     Motor: No abnormal muscle tone.     Coordination: Coordination normal.     Gait: Gait normal.     Deep Tendon Reflexes: Reflexes are normal and symmetric.  Psychiatric:        Behavior: Behavior normal.        Thought Content: Thought content normal.        Judgment: Judgment normal.     Lab Results  Component Value Date   WBC 7.5 03/29/2019     HGB 16.2 03/29/2019   HCT 47.9 03/29/2019   PLT 246.0 03/29/2019   GLUCOSE 98 03/29/2019   CHOL 190 03/29/2019   TRIG 131.0 03/29/2019   HDL 41.10 03/29/2019   LDLCALC 123 (H) 03/29/2019   ALT 17 03/29/2019   AST 18 03/29/2019   NA 141 03/29/2019   K 4.5 03/29/2019   CL 102 03/29/2019   CREATININE 1.06 03/29/2019   BUN 12 03/29/2019   CO2 31 03/29/2019   TSH 0.74 03/29/2019   PSA 3.34 03/29/2019   HGBA1C 5.5 10/29/2017    No results found.  Assessment & Plan:    Walker Kehr, MD

## 2020-04-25 NOTE — Assessment & Plan Note (Signed)
We discussed age appropriate health related issues, including available/recomended screening tests and vaccinations. We discussed a need for adhering to healthy diet and exercise. Labs were ordered to be later reviewed . All questions were answered.   

## 2020-04-25 NOTE — Assessment & Plan Note (Signed)
On B12 

## 2020-04-25 NOTE — Patient Instructions (Signed)
These suggestions will probably help you to improve your metabolism if you are not overweight and to lose weight if you are overweight: 1.  Reduce your consumption of sugars and starches.  Eliminate high fructose corn syrup from your diet.  Reduce your consumption of processed foods.  For desserts try to have seasonal fruits, berries, nuts, cheeses or dark chocolate with more than 70% cacao. 2.  Do not snack 3.  You do not have to eat breakfast.  If you choose to have breakfast - eat plain greek yogurt, eggs, oatmeal (without sugar) - use honey if you need to. 4.  Drink water, freshly brewed unsweetened tea (green, black or herbal) or coffee.  Do not drink sodas including diet sodas , juices, beverages sweetened with artificial sweeteners. 5.  Reduce your consumption of refined grains. 6.  Avoid protein drinks such as Optifast, Slim fast etc. Eat chicken, fish, meat, dairy and beans for your sources of protein. 7.  Natural unprocessed fats like cold pressed virgin olive oil, butter, coconut oil are good for you.  Eat avocados. 8.  Increase your consumption of fiber.  Fruits, berries, vegetables, whole grains, flaxseed, chia seeds, beans, popcorn, nuts, oatmeal are good sources of fiber 9.  Use vinegar in your diet, i.e. apple cider vinegar, red wine or balsamic vinegar 10.  You can try fasting.  For example you can skip breakfast and lunch every other day (24-hour fast) 11.  Stress reduction, good night sleep, relaxation, meditation, yoga and other physical activity is likely to help you to maintain low weight too. 12.  If you drink alcohol, limit your alcohol intake to no more than 2 drinks a day.   Mediterranean diet is good for you. ([employer] has a typical Mediterranean cuisine menu) The Mediterranean diet is a way of eating based on the traditional cuisine of countries bordering the [employer]. While there is no single definition of the Mediterranean diet, it is typically high in  vegetables, fruits, whole grains, beans, nut and seeds, and olive oil. The main components of Mediterranean diet include:  Daily consumption of vegetables, fruits, whole grains and healthy fats   Weekly intake of fish, poultry, beans and eggs   Moderate portions of dairy products   Limited intake of red meat Other important elements of the Mediterranean diet are sharing meals with family and friends, enjoying a glass of red wine and being physically active. Health benefits of a Mediterranean diet: A traditional Mediterranean diet consisting of large quantities of fresh fruits and vegetables, nuts, fish and olive oil--coupled with physical activity--can reduce your risk of serious mental and physical health problems by: Preventing heart disease and strokes. Following a Mediterranean diet limits your intake of refined breads, processed foods, and red meat, and encourages drinking red wine instead of hard liquor--all factors that can help prevent heart disease and stroke. Keeping you agile. If youre an older adult, the nutrients gained with a Mediterranean diet may reduce your risk of developing muscle weakness and other signs of frailty by about 70 percent. Reducing the risk of Alzheimers. Research suggests that the [place] diet may improve cholesterol, blood sugar levels, and overall blood vessel health, which in turn may reduce your risk of Alzheimers disease or dementia. Halving the risk of Parkinsons disease. The high levels of antioxidants in the Mediterranean diet can prevent cells from undergoing a damaging process called oxidative stress, thereby cutting the risk of Parkinsons disease in half. Increasing longevity. By reducing your risk of  developing heart disease or cancer with the Mediterranean diet, youre reducing your risk of death at any age by 20%. Protecting against type 2 diabetes. A Mediterranean diet is rich in fiber which digests slowly, prevents huge swings in blood  sugar, and can help you maintain a healthy weight.    Cabbage soup recipe that will not make you gain weight: Take 1 small head of cabbage, 1 average pack of celery, 4 green peppers, 4 onions, 2 cans diced tomatoes (they are not available without salt), salt and spices to taste.  Chop cabbage, celery, peppers and onions.  And tomatoes and 2-2.5 liters (2.5 quarts) of water so that it would just cover the vegetables.  Bring to boil.  Add spices and salt.  Turn heat to low/medium and simmer for 20-25 minutes.  Naturally, you can make a smaller batch and change some of the ingredients.

## 2020-05-22 ENCOUNTER — Ambulatory Visit (INDEPENDENT_AMBULATORY_CARE_PROVIDER_SITE_OTHER): Payer: BC Managed Care – PPO

## 2020-05-22 ENCOUNTER — Other Ambulatory Visit: Payer: Self-pay

## 2020-05-22 DIAGNOSIS — E538 Deficiency of other specified B group vitamins: Secondary | ICD-10-CM | POA: Diagnosis not present

## 2020-05-22 MED ORDER — CYANOCOBALAMIN 1000 MCG/ML IJ SOLN
1000.0000 ug | Freq: Once | INTRAMUSCULAR | Status: AC
  Administered 2020-05-22: 1000 ug via INTRAMUSCULAR

## 2020-05-22 NOTE — Progress Notes (Addendum)
B12 given and tolerated well  Medical screening examination/treatment/procedure(s) were performed by non-physician practitioner and as supervising physician I was immediately available for consultation/collaboration.  I agree with above. Aleksei Plotnikov, MD  

## 2020-06-01 ENCOUNTER — Ambulatory Visit: Payer: BC Managed Care – PPO | Attending: Internal Medicine

## 2020-06-01 DIAGNOSIS — Z23 Encounter for immunization: Secondary | ICD-10-CM

## 2020-06-01 NOTE — Progress Notes (Signed)
   Covid-[facility] Vaccination [facility]  Name:  William Acosta    MRN: [id number] DOB: 14-May-1956  06/01/2020  [name] was observed post Covid-19 immunization for 15 minutes without incident. He was provided with Vaccine Information Sheet and instruction to access the V-Safe system.   [name] was instructed to call 911 with any severe reactions post vaccine: Marland Kitchen Difficulty breathing  . Swelling of face and throat  . A fast heartbeat  . A bad rash all over body  . Dizziness and weakness   Immunizations Administered    Name Date Dose VIS Date Route   Pfizer COVID-19 Vaccine 06/01/2020  9:15 AM 0.3 mL 04/17/2020 Intramuscular   Manufacturer: [place]   Lot: [id number]   NDC: [address]-5

## 2020-06-24 ENCOUNTER — Ambulatory Visit: Payer: BC Managed Care – PPO

## 2020-07-25 ENCOUNTER — Ambulatory Visit: Payer: BC Managed Care – PPO

## 2020-07-30 ENCOUNTER — Other Ambulatory Visit: Payer: Self-pay

## 2020-07-31 ENCOUNTER — Encounter: Payer: Self-pay | Admitting: Internal Medicine

## 2020-07-31 ENCOUNTER — Ambulatory Visit: Payer: BC Managed Care – PPO | Admitting: Internal Medicine

## 2020-07-31 DIAGNOSIS — F329 Major depressive disorder, single episode, unspecified: Secondary | ICD-10-CM | POA: Diagnosis not present

## 2020-07-31 DIAGNOSIS — I10 Essential (primary) hypertension: Secondary | ICD-10-CM | POA: Diagnosis not present

## 2020-07-31 DIAGNOSIS — E538 Deficiency of other specified B group vitamins: Secondary | ICD-10-CM | POA: Diagnosis not present

## 2020-07-31 DIAGNOSIS — F413 Other mixed anxiety disorders: Secondary | ICD-10-CM | POA: Diagnosis not present

## 2020-07-31 DIAGNOSIS — F439 Reaction to severe stress, unspecified: Secondary | ICD-10-CM

## 2020-07-31 NOTE — Assessment & Plan Note (Addendum)
On Losartan 

## 2020-07-31 NOTE — Assessment & Plan Note (Addendum)
Stress discussed.  Xanax prn - rare  Potential benefits of a long term benzodiazepines  use as well as potential risks  and complications were explained to the patient and were aknowledged. William Acosta has been under a lot of stress with his mother's care (she has dementia and continues to lives alone, her dementia is progressing)

## 2020-07-31 NOTE — Assessment & Plan Note (Addendum)
Continue with vitamin B12 therapy

## 2020-07-31 NOTE — Progress Notes (Signed)
Subjective:  Patient ID: William Acosta, male    DOB: April 19, 1956  Age: 65 y.o. MRN: [id number]  CC: Follow-up (3 month f/u)   HPI William Acosta presents for HTN, B12, anxiety f/u William Acosta has been under a lot of stress with his mother's care (she has dementia and continues to lives alone, her dementia is progressing)  Outpatient Medications Prior to Visit  Medication Sig Dispense Refill  . cyanocobalamin (,VITAMIN B-12,) 1000 MCG/ML injection Inject 1,000 mcg into the muscle every 30 (thirty) days.    Marland Kitchen griseofulvin (GRIFULVIN V) 500 MG tablet Take 500 mg by mouth daily. Take 1 by mouth daily for 30 days    . telmisartan (MICARDIS) 80 MG tablet Take 1 tablet (80 mg total) by mouth daily. Must keep appointment to get refills 90 tablet 3  . phentermine (ADIPEX-P) 37.5 MG tablet Take 1 tablet (37.5 mg total) by mouth daily before breakfast. 30 tablet 2   No facility-administered medications prior to visit.    ROS: Review of Systems  Constitutional: Negative for appetite change, fatigue and unexpected weight change.  HENT: Negative for congestion, nosebleeds, sneezing, sore throat and trouble swallowing.   Eyes: Negative for itching and visual disturbance.  Respiratory: Negative for cough.   Cardiovascular: Negative for chest pain, palpitations and leg swelling.  Gastrointestinal: Negative for abdominal distention, blood in stool, diarrhea and nausea.  Genitourinary: Negative for frequency and hematuria.  Musculoskeletal: Negative for back pain, gait problem, joint swelling and neck pain.  Skin: Negative for rash.  Neurological: Negative for dizziness, tremors, speech difficulty and weakness.  Psychiatric/Behavioral: Negative for agitation, dysphoric mood, sleep disturbance and suicidal ideas. The patient is nervous/anxious.     Objective:  BP 132/70 (BP Location: Left Arm)   Pulse 97   Temp 98.6 F (37 C) (Oral)   Ht 5\' 8"  (1.727 m)   Wt 221 lb 6.4 oz (100.4 kg)   SpO2 99%    BMI 33.66 kg/m   BP Readings from Last 3 Encounters:  07/31/20 132/70  04/25/20 122/84  01/25/20 (!) 138/78    Wt Readings from Last 3 Encounters:  07/31/20 221 lb 6.4 oz (100.4 kg)  04/25/20 220 lb 12.8 oz (100.2 kg)  01/25/20 (!) 223 lb 2 oz (101.2 kg)    Physical Exam Constitutional:      General: He is not in acute distress.    Appearance: He is well-developed. He is obese.     Comments: NAD  HENT:     Mouth/Throat:     Mouth: Oropharynx is clear and moist.  Eyes:     Conjunctiva/sclera: Conjunctivae normal.     Pupils: Pupils are equal, round, and reactive to light.  Neck:     Thyroid: No thyromegaly.     Vascular: No JVD.  Cardiovascular:     Rate and Rhythm: Normal rate and regular rhythm.     Pulses: Intact distal pulses.     Heart sounds: Normal heart sounds. No murmur heard. No friction rub. No gallop.   Pulmonary:     Effort: Pulmonary effort is normal. No respiratory distress.     Breath sounds: Normal breath sounds. No wheezing or rales.  Chest:     Chest wall: No tenderness.  Abdominal:     General: Bowel sounds are normal. There is no distension.     Palpations: Abdomen is soft. There is no mass.     Tenderness: There is no abdominal tenderness. There is no guarding or rebound.  Musculoskeletal:        General: No tenderness or edema. Normal range of motion.     Cervical back: Normal range of motion.  Lymphadenopathy:     Cervical: No cervical adenopathy.  Skin:    General: Skin is warm and dry.     Findings: No rash.  Neurological:     Mental Status: He is alert and oriented to person, place, and time.     Cranial Nerves: No cranial nerve deficit.     Motor: No abnormal muscle tone.     Coordination: He displays a negative Romberg sign. Coordination normal.     Gait: Gait normal.     Deep Tendon Reflexes: Reflexes are normal and symmetric.  Psychiatric:        Mood and Affect: Mood and affect normal.        Behavior: Behavior normal.         Thought Content: Thought content normal.        Judgment: Judgment normal.     Lab Results  Component Value Date   WBC 7.5 03/29/2019   HGB 16.2 03/29/2019   HCT 47.9 03/29/2019   PLT 246.0 03/29/2019   GLUCOSE 98 03/29/2019   CHOL 190 03/29/2019   TRIG 131.0 03/29/2019   HDL 41.10 03/29/2019   LDLCALC 123 (H) 03/29/2019   ALT 17 03/29/2019   AST 18 03/29/2019   NA 141 03/29/2019   K 4.5 03/29/2019   CL 102 03/29/2019   CREATININE 1.06 03/29/2019   BUN 12 03/29/2019   CO2 31 03/29/2019   TSH 0.74 03/29/2019   PSA 3.34 03/29/2019   HGBA1C 5.5 10/29/2017    No results found.  Assessment & Plan:    Follow-up: No follow-ups on file.  Walker Kehr, MD

## 2020-07-31 NOTE — Assessment & Plan Note (Addendum)
William Acosta has been under a lot of stress with his mother's care (she has dementia and continues to lives alone, her dementia is progressing).  Stress was discussed Currently off antidepressants

## 2020-08-01 ENCOUNTER — Other Ambulatory Visit (INDEPENDENT_AMBULATORY_CARE_PROVIDER_SITE_OTHER): Payer: BC Managed Care – PPO

## 2020-08-01 DIAGNOSIS — Z125 Encounter for screening for malignant neoplasm of prostate: Secondary | ICD-10-CM

## 2020-08-01 DIAGNOSIS — E538 Deficiency of other specified B group vitamins: Secondary | ICD-10-CM

## 2020-08-01 DIAGNOSIS — Z0000 Encounter for general adult medical examination without abnormal findings: Secondary | ICD-10-CM

## 2020-08-01 LAB — CBC WITH DIFFERENTIAL/PLATELET
Basophils Absolute: 0 10*3/uL (ref 0.0–0.1)
Basophils Relative: 0.6 % (ref 0.0–3.0)
Eosinophils Absolute: 0.1 10*3/uL (ref 0.0–0.7)
Eosinophils Relative: 1.5 % (ref 0.0–5.0)
HCT: 48.1 % (ref 39.0–52.0)
Hemoglobin: 16.2 g/dL (ref 13.0–17.0)
Lymphocytes Relative: 38.7 % (ref 12.0–46.0)
Lymphs Abs: 2.7 10*3/uL (ref 0.7–4.0)
MCHC: 33.8 g/dL (ref 30.0–36.0)
MCV: 90.1 fl (ref 78.0–100.0)
Monocytes Absolute: 0.5 10*3/uL (ref 0.1–1.0)
Monocytes Relative: 6.8 % (ref 3.0–12.0)
Neutro Abs: 3.6 10*3/uL (ref 1.4–7.7)
Neutrophils Relative %: 52.4 % (ref 43.0–77.0)
Platelets: 263 10*3/uL (ref 150.0–400.0)
RBC: 5.34 Mil/uL (ref 4.22–5.81)
RDW: 13.1 % (ref 11.5–15.5)
WBC: 6.9 10*3/uL (ref 4.0–10.5)

## 2020-08-01 LAB — URINALYSIS
Bilirubin Urine: NEGATIVE
Hgb urine dipstick: NEGATIVE
Ketones, ur: NEGATIVE
Leukocytes,Ua: NEGATIVE
Nitrite: NEGATIVE
Specific Gravity, Urine: 1.02 (ref 1.000–1.030)
Total Protein, Urine: NEGATIVE
Urine Glucose: NEGATIVE
Urobilinogen, UA: 0.2 (ref 0.0–1.0)
pH: 6 (ref 5.0–8.0)

## 2020-08-01 LAB — PSA: PSA: 3.05 ng/mL (ref 0.10–4.00)

## 2020-08-01 LAB — COMPREHENSIVE METABOLIC PANEL
ALT: 14 U/L (ref 0–53)
AST: 16 U/L (ref 0–37)
Albumin: 4.1 g/dL (ref 3.5–5.2)
Alkaline Phosphatase: 105 U/L (ref 39–117)
BUN: 10 mg/dL (ref 6–23)
CO2: 31 mEq/L (ref 19–32)
Calcium: 9.3 mg/dL (ref 8.4–10.5)
Chloride: 105 mEq/L (ref 96–112)
Creatinine, Ser: 1.03 mg/dL (ref 0.40–1.50)
GFR: 75.6 mL/min (ref >60.00)
Glucose, Bld: 99 mg/dL (ref 70–99)
Potassium: 4.5 mEq/L (ref 3.5–5.1)
Sodium: 140 mEq/L (ref 135–145)
Total Bilirubin: 0.6 mg/dL (ref 0.2–1.2)
Total Protein: 6.7 g/dL (ref 6.0–8.3)

## 2020-08-01 LAB — LIPID PANEL
Cholesterol: 182 mg/dL (ref 0–200)
HDL: 39.4 mg/dL (ref >39.00)
LDL Cholesterol: 114 mg/dL — ABNORMAL HIGH (ref 0–99)
NonHDL: 142.9
Total CHOL/HDL Ratio: 5
Triglycerides: 143 mg/dL (ref 0.0–149.0)
VLDL: 28.6 mg/dL (ref 0.0–40.0)

## 2020-08-01 LAB — TSH: TSH: 0.75 u[IU]/mL (ref 0.35–4.50)

## 2020-08-01 LAB — VITAMIN B12: Vitamin B-12: 195 pg/mL — ABNORMAL LOW (ref 211–911)

## 2020-08-04 NOTE — Assessment & Plan Note (Addendum)
William Acosta has been under a lot of stress with his mother's care (she has dementia and continues to lives alone, her dementia is progressing) --coping mechanisms were discussed.

## 2020-08-07 ENCOUNTER — Other Ambulatory Visit: Payer: Self-pay

## 2020-08-07 ENCOUNTER — Ambulatory Visit (INDEPENDENT_AMBULATORY_CARE_PROVIDER_SITE_OTHER): Payer: BC Managed Care – PPO | Admitting: *Deleted

## 2020-08-07 DIAGNOSIS — E538 Deficiency of other specified B group vitamins: Secondary | ICD-10-CM | POA: Diagnosis not present

## 2020-08-07 MED ORDER — CYANOCOBALAMIN 1000 MCG/ML IJ SOLN
1000.0000 ug | Freq: Once | INTRAMUSCULAR | Status: AC
  Administered 2020-08-07: 1000 ug via INTRAMUSCULAR

## 2020-08-07 NOTE — Progress Notes (Addendum)
Pls cosign for B12 inj../lmb   Medical screening examination/treatment/procedure(s) were performed by non-physician practitioner and as supervising physician I was immediately available for consultation/collaboration.  I agree with above. Aleksei Plotnikov, MD  

## 2020-09-05 ENCOUNTER — Ambulatory Visit: Payer: BC Managed Care – PPO

## 2020-09-12 ENCOUNTER — Ambulatory Visit: Payer: BC Managed Care – PPO

## 2020-09-19 ENCOUNTER — Other Ambulatory Visit: Payer: Self-pay

## 2020-09-19 ENCOUNTER — Ambulatory Visit (INDEPENDENT_AMBULATORY_CARE_PROVIDER_SITE_OTHER): Payer: BC Managed Care – PPO

## 2020-09-19 DIAGNOSIS — E538 Deficiency of other specified B group vitamins: Secondary | ICD-10-CM

## 2020-09-19 MED ORDER — CYANOCOBALAMIN 1000 MCG/ML IJ SOLN
1000.0000 ug | INTRAMUSCULAR | Status: AC
  Administered 2020-09-19 – 2021-09-10 (×9): 1000 ug via INTRAMUSCULAR

## 2020-09-19 NOTE — Progress Notes (Addendum)
Pt here for monthly B12 injection per Dr Alain Marion.  B12 1056mcg given IM right deltoid and pt tolerated injection well.  Next B12 injection scheduled for 10/21/20.  Medical screening examination/treatment/procedure(s) were performed by non-physician practitioner and as supervising physician I was immediately available for consultation/collaboration.  I agree with above. Lew Dawes, MD

## 2020-09-26 ENCOUNTER — Ambulatory Visit: Payer: BC Managed Care – PPO | Admitting: Internal Medicine

## 2020-09-27 ENCOUNTER — Ambulatory Visit (INDEPENDENT_AMBULATORY_CARE_PROVIDER_SITE_OTHER): Payer: BC Managed Care – PPO | Admitting: Otolaryngology

## 2020-09-27 ENCOUNTER — Other Ambulatory Visit: Payer: Self-pay

## 2020-09-27 ENCOUNTER — Encounter (INDEPENDENT_AMBULATORY_CARE_PROVIDER_SITE_OTHER): Payer: Self-pay | Admitting: Otolaryngology

## 2020-09-27 VITALS — Temp 97.5°F

## 2020-09-27 DIAGNOSIS — M26609 Unspecified temporomandibular joint disorder, unspecified side: Secondary | ICD-10-CM

## 2020-09-27 DIAGNOSIS — H903 Sensorineural hearing loss, bilateral: Secondary | ICD-10-CM

## 2020-09-27 DIAGNOSIS — H6123 Impacted cerumen, bilateral: Secondary | ICD-10-CM

## 2020-09-27 NOTE — Progress Notes (Signed)
HPI: William Acosta is a 65 y.o. male who returns today for evaluation of complaints of the right ear feeling full and fuzzy.  He is also has some pain in the right ear.  He feels like he is not hearing as well as he should in both ears.  He describes pain in the back of his neck and back of the head..  Past Medical History:  Diagnosis Date  . Allergic rhinitis   . Allergy   . Arthritis   . Cancer ([facility])    skin melanoma on right leg 2010  . Depression   . Diverticulosis   . GERD (gastroesophageal reflux disease)   . Hyperlipidemia   . Hypertension   . Multinodular goiter    Past Surgical History:  Procedure Laterality Date  . SALIVARY GLAND SURGERY     Right   Social History   Socioeconomic History  . Marital status: Single    Spouse name: Not on file  . Number of children: 0  . Years of education: Not on file  . Highest education level: Not on file  Occupational History  . Occupation: Youth worker  Tobacco Use  . Smoking status: Former Smoker    Types: Cigarettes    Quit date: 1987    Years since quitting: 35.2  . Smokeless tobacco: Never Used  Vaping Use  . Vaping Use: Never used  Substance and Sexual Activity  . Alcohol use: Yes    Comment: rarely  . Drug use: No  . Sexual activity: Not Currently  Other Topics Concern  . Not on file  Social History Narrative   Regular Exercise-Yes    Social Determinants of Health   Financial Resource Strain: Not on file  Food Insecurity: Not on file  Transportation Needs: Not on file  Physical Activity: Not on file  Stress: Not on file  Social Connections: Not on file   Family History  Problem Relation Age of Onset  . Hypertension Mother   . Heart disease Father   . Heart attack Father        smoker  . Diabetes Other        1st degree relative  . Heart disease Other   . Pancreatic cancer Maternal Grandmother   . Breast cancer Maternal Grandmother        mets to pancreas  . Colon cancer Neg Hx     Allergies  Allergen Reactions  . Moxifloxacin     REACTION: felt bad   Prior to Admission medications   Medication Sig Start Date End Date Taking? Authorizing Provider  cyanocobalamin (,VITAMIN B-12,) 1000 MCG/ML injection Inject 1,000 mcg into the muscle every 30 (thirty) days.    [provider]  telmisartan (MICARDIS) 80 MG tablet Take 1 tablet (80 mg total) by mouth daily. Must keep appointment to get refills 04/05/20   Plotnikov, Evie Lacks, MD     Positive ROS: Otherwise negative  All other systems have been reviewed and were otherwise negative with the exception of those mentioned in the HPI and as above.  Physical Exam: Constitutional: Alert, well-appearing, no acute distress Ears: External ears without lesions or tenderness.  He has moderate amount of wax buildup in the right ear and minimal wax buildup in the left ear.  Both ear canals reveal a large amount of hairs.  After cleaning the wax and hairs from both ear canals with suctioning forceps.  The TMs were clear bilaterally.  He was treated with a 512  1024 tuning fork revealed perhaps a mild upper frequency hearing loss with a 1024 tuning fork. Nasal: External nose without lesions. Septum with minimal deformity and mild rhinitis.. Clear nasal passages otherwise with no signs of infection. Oral: Lips and gums without lesions. Tongue and palate mucosa without lesions. Posterior oropharynx clear. Neck: No palpable adenopathy or masses.  The pain he describes in front of the left ear may be more related to TMJ problems although on palpation he has minimal pain on exam today. Respiratory: Breathing comfortably  Skin: No facial/neck lesions or rash noted.  Cerumen impaction removal  Date/Time: 09/27/2020 5:22 PM Performed by: Rozetta Nunnery, MD Authorized by: Rozetta Nunnery, MD   Consent:    Consent obtained:  Verbal   Consent given by:  Patient   Risks discussed:  Pain and bleeding Procedure details:     Location:  L ear and R ear   Procedure type: suction and forceps   Post-procedure details:    Inspection:  TM intact and canal normal   Hearing quality:  Improved   Patient tolerance of procedure:  Tolerated well, no immediate complications Comments:     Patient with wax more on the right side that was cleaned with forceps and suction.  He also had a lot of hairs in both ear canals that were cleaned with forceps.  TMs were clear bilaterally otherwise.    Assessment: Wax and hairs in both ear canals right side worse than left.  Ear pain is probably more TMJ related as his ear canal and TM are otherwise clear. 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  Plan: Cleaned his ears in the office today. Briefly discussed with him concerning possibly obtaining a hearing test to evaluate his hearing if he is continue to have hearing problems but only treatment options for this would be hearing aids. For the ear pain recommended NSAID as needed.  And I reviewed with him that it most likely is coming from his TMJ area.   Radene Journey, MD

## 2020-10-02 ENCOUNTER — Ambulatory Visit: Payer: BC Managed Care – PPO | Admitting: Internal Medicine

## 2020-10-21 ENCOUNTER — Ambulatory Visit (INDEPENDENT_AMBULATORY_CARE_PROVIDER_SITE_OTHER): Payer: BC Managed Care – PPO

## 2020-10-21 ENCOUNTER — Other Ambulatory Visit: Payer: Self-pay

## 2020-10-21 DIAGNOSIS — E538 Deficiency of other specified B group vitamins: Secondary | ICD-10-CM

## 2020-10-21 NOTE — Progress Notes (Addendum)
Pt here for monthly B12 injection per Dr. Plotnikov  B12 1000mcg given left deltoid IM, and pt tolerated injection well.  Next B12 injection scheduled for 11/20/20.   Medical screening examination/treatment/procedure(s) were performed by non-physician practitioner and as supervising physician I was immediately available for consultation/collaboration.  I agree with above. Aleksei Plotnikov, MD  

## 2020-10-29 ENCOUNTER — Telehealth: Payer: Self-pay | Admitting: Internal Medicine

## 2020-10-29 MED ORDER — ALPRAZOLAM 0.25 MG PO TABS
0.2500 mg | ORAL_TABLET | Freq: Two times a day (BID) | ORAL | 0 refills | Status: DC | PRN
Start: 2020-10-29 — End: 2021-11-12

## 2020-10-29 NOTE — Telephone Encounter (Signed)
Ok Thx 

## 2020-10-29 NOTE — Telephone Encounter (Signed)
ALPRAZolam (XANAX) 0.25 MG tablet [id number] [address], [place] - [place] Phone:  [phone]  Fax:  [phone]     Last seen- 02.02.22 Next apt -n.a

## 2020-11-20 ENCOUNTER — Ambulatory Visit (INDEPENDENT_AMBULATORY_CARE_PROVIDER_SITE_OTHER): Payer: BC Managed Care – PPO

## 2020-11-20 DIAGNOSIS — E538 Deficiency of other specified B group vitamins: Secondary | ICD-10-CM | POA: Diagnosis not present

## 2020-11-20 NOTE — Progress Notes (Signed)
Pt here for monthly B12 injection per Dr Alain Marion.   B12 1022mcg given IM left deltoid and pt tolerated injection well.

## 2020-12-25 ENCOUNTER — Ambulatory Visit (INDEPENDENT_AMBULATORY_CARE_PROVIDER_SITE_OTHER): Payer: BC Managed Care – PPO

## 2020-12-25 ENCOUNTER — Other Ambulatory Visit: Payer: Self-pay

## 2020-12-25 DIAGNOSIS — E538 Deficiency of other specified B group vitamins: Secondary | ICD-10-CM

## 2020-12-25 NOTE — Progress Notes (Addendum)
Pt here for monthly B12 injection per Dr Alain Marion.  B12 1057mcg given IM right deltoid and pt tolerated injection well.  Next B12 injection scheduled for 01/24/21.  Medical screening examination/treatment/procedure(s) were performed by non-physician practitioner and as supervising physician I was immediately available for consultation/collaboration.  I agree with above. Lew Dawes, MD

## 2021-01-24 ENCOUNTER — Ambulatory Visit (INDEPENDENT_AMBULATORY_CARE_PROVIDER_SITE_OTHER): Payer: BC Managed Care – PPO

## 2021-01-24 ENCOUNTER — Other Ambulatory Visit: Payer: Self-pay

## 2021-01-24 DIAGNOSIS — E538 Deficiency of other specified B group vitamins: Secondary | ICD-10-CM

## 2021-01-24 NOTE — Progress Notes (Signed)
B12 given Please cosign 

## 2021-02-26 ENCOUNTER — Ambulatory Visit: Payer: BC Managed Care – PPO

## 2021-02-28 ENCOUNTER — Ambulatory Visit (INDEPENDENT_AMBULATORY_CARE_PROVIDER_SITE_OTHER): Payer: BC Managed Care – PPO

## 2021-02-28 ENCOUNTER — Other Ambulatory Visit: Payer: Self-pay

## 2021-02-28 DIAGNOSIS — E538 Deficiency of other specified B group vitamins: Secondary | ICD-10-CM | POA: Diagnosis not present

## 2021-02-28 NOTE — Progress Notes (Signed)
Pt came into the office to receive his Vitamin B12 shot. He tolerated injection well. No other questions or concerns.

## 2021-03-31 ENCOUNTER — Ambulatory Visit: Payer: BC Managed Care – PPO

## 2021-04-10 ENCOUNTER — Other Ambulatory Visit: Payer: Self-pay

## 2021-04-10 ENCOUNTER — Ambulatory Visit (INDEPENDENT_AMBULATORY_CARE_PROVIDER_SITE_OTHER): Payer: BC Managed Care – PPO

## 2021-04-10 DIAGNOSIS — E538 Deficiency of other specified B group vitamins: Secondary | ICD-10-CM | POA: Diagnosis not present

## 2021-04-10 NOTE — Progress Notes (Addendum)
Pt came into the office to receive his Vitamin B12 shot. He tolerated the injection well.   Medical screening examination/treatment/procedure(s) were performed by non-physician practitioner and as supervising physician I was immediately available for consultation/collaboration.  I agree with above. Lew Dawes, MD

## 2021-05-12 ENCOUNTER — Ambulatory Visit: Payer: BC Managed Care – PPO

## 2021-05-21 ENCOUNTER — Ambulatory Visit: Payer: BC Managed Care – PPO | Admitting: Nurse Practitioner

## 2021-05-21 ENCOUNTER — Other Ambulatory Visit: Payer: Self-pay

## 2021-05-21 VITALS — BP 118/72 | HR 98 | Temp 97.6°F | Resp 14 | Ht 68.0 in | Wt 227.1 lb

## 2021-05-21 DIAGNOSIS — L309 Dermatitis, unspecified: Secondary | ICD-10-CM

## 2021-05-21 DIAGNOSIS — J069 Acute upper respiratory infection, unspecified: Secondary | ICD-10-CM | POA: Diagnosis not present

## 2021-05-21 MED ORDER — DOXYCYCLINE HYCLATE 100 MG PO TABS: 100.0000 mg | ORAL_TABLET | Freq: Two times a day (BID) | ORAL | 0 refills | Status: DC

## 2021-05-21 MED ORDER — TRIAMCINOLONE ACETONIDE 0.1 % EX CREA: 1.0000 "application " | TOPICAL_CREAM | Freq: Two times a day (BID) | CUTANEOUS | 0 refills | Status: DC

## 2021-05-21 NOTE — Assessment & Plan Note (Signed)
Mild to moderate given length of symptoms without resolution we will go ahead and treat patient with antibiotics.  Discussed this with him follow-up if no improvement. Start doxycycline 100 mg twice daily for 7 days

## 2021-05-21 NOTE — Progress Notes (Signed)
Acute Office Visit  Subjective:    Patient ID: William Acosta, male    DOB: 1956/01/11, 65 y.o.   MRN: [id number]  Chief Complaint  Patient presents with   Rash    X 1 week, some pain present. Rash on the left buttocks, feels rough. No itching   Sinus Problem    X 3 weeks off and on. Sinus pressure, ears ache off and on, runny nose, post nasal drip, head congested. No fever. He was blowing out green mucus but not anymore.    Rash Associated symptoms include coughing. Pertinent negatives include no congestion, diarrhea, fever, shortness of breath, sore throat or vomiting.  Sinus Problem Associated symptoms include coughing, ear pain and sinus pressure. Pertinent negatives include no chills, congestion, headaches, shortness of breath or sore throat.  Patient is in today for Rash States approx 1-2 weeks ago. Started putting selson blue on it and it did not help. Stinging burning pain and some itching. No itching. Has been around his dogs. States had it before and it was jock itch  Sinus problem: symptoms started about three weeks ago. Have not improved have stayed the sameGets it yearly. States that a coworker was ill but in his own office.  No OTC treatment thus far.   Past Medical History:  Diagnosis Date   Allergic rhinitis    Allergy    Arthritis    Cancer ([place])    skin melanoma on right leg 2010   Depression    Diverticulosis    GERD (gastroesophageal reflux disease)    Hyperlipidemia    Hypertension    Multinodular goiter     Past Surgical History:  Procedure Laterality Date   SALIVARY GLAND SURGERY     Right    Family History  Problem Relation Age of Onset   Hypertension Mother    Heart disease Father    Heart attack Father        smoker   Diabetes Other        1st degree relative   Heart disease Other    Pancreatic cancer Maternal Grandmother    Breast cancer Maternal Grandmother        mets to pancreas   Colon cancer Neg Hx     Social History    Socioeconomic History   Marital status: Single    Spouse name: Not on file   Number of children: 0   Years of education: Not on file   Highest education level: Not on file  Occupational History   Occupation: Buyer, retail and contracts  Tobacco Use   Smoking status: Former    Types: Cigarettes    Quit date: 1987    Years since quitting: 35.9   Smokeless tobacco: Never  Vaping Use   Vaping Use: Never used  Substance and Sexual Activity   Alcohol use: Yes    Comment: rarely   Drug use: No   Sexual activity: Not Currently  Other Topics Concern   Not on file  Social History Narrative   Regular Exercise-Yes    Social Determinants of Health   Financial Resource Strain: Not on file  Food Insecurity: Not on file  Transportation Needs: Not on file  Physical Activity: Not on file  Stress: Not on file  Social Connections: Not on file  Intimate Partner Violence: Not on file    Outpatient Medications Prior to Visit  Medication Sig Dispense Refill   ALPRAZolam (XANAX) 0.25 MG tablet Take 1 tablet (0.25 mg total)  by mouth 2 (two) times daily as needed for anxiety. 30 tablet 0   cyanocobalamin (,VITAMIN B-12,) 1000 MCG/ML injection Inject 1,000 mcg into the muscle every 30 (thirty) days.     telmisartan (MICARDIS) 80 MG tablet Take 1 tablet (80 mg total) by mouth daily. Must keep appointment to get refills 90 tablet 3   Facility-Administered Medications Prior to Visit  Medication Dose Route Frequency Provider Last Rate Last Admin   cyanocobalamin ((VITAMIN B-12)) injection 1,000 mcg  1,000 mcg Intramuscular Q30 days Plotnikov, Evie Lacks, MD   1,000 mcg at 04/10/21 1457    Allergies  Allergen Reactions   Moxifloxacin     REACTION: felt bad    Review of Systems  Constitutional:  Negative for chills and fever.  HENT:  Positive for ear pain, postnasal drip, sinus pressure, sinus pain and voice change. Negative for congestion and sore throat.   Respiratory:  Positive for cough.  Negative for shortness of breath.   Cardiovascular:  Negative for chest pain.  Gastrointestinal:  Negative for diarrhea, nausea and vomiting.  Skin:  Positive for rash.  Neurological:  Negative for headaches.      Objective:    Physical Exam Vitals and nursing note reviewed.  HENT:     Right Ear: Tympanic membrane, ear canal and external ear normal. There is no impacted cerumen.     Left Ear: Tympanic membrane, ear canal and external ear normal. There is no impacted cerumen.     Nose:     Right Sinus: No maxillary sinus tenderness or frontal sinus tenderness.     Left Sinus: No maxillary sinus tenderness or frontal sinus tenderness.     Mouth/Throat:     Mouth: Mucous membranes are moist.  Eyes:     Extraocular Movements: Extraocular movements intact.  Neck:     Comments: Mass to left upper neck area. Stable states PCP and him are aware of it Cardiovascular:     Rate and Rhythm: Normal rate and regular rhythm.  Pulmonary:     Effort: Pulmonary effort is normal.     Breath sounds: Normal breath sounds.  Abdominal:     General: Bowel sounds are normal.  Skin:    General: Skin is warm.     Findings: Rash present. Rash is scaling.       Neurological:     Mental Status: He is alert.  Psychiatric:        Mood and Affect: Mood normal.        Behavior: Behavior normal.        Thought Content: Thought content normal.        Judgment: Judgment normal.      BP 118/72   Pulse 98   Temp 97.6 F (36.4 C)   Resp 14   Ht 5\' 8"  (1.727 m)   Wt 227 lb 2 oz (103 kg)   SpO2 98%   BMI 34.53 kg/m  Wt Readings from Last 3 Encounters:  05/21/21 227 lb 2 oz (103 kg)  07/31/20 221 lb 6.4 oz (100.4 kg)  04/25/20 220 lb 12.8 oz (100.2 kg)    Health Maintenance Due  Topic Date Due   Hepatitis C Screening  Never done   Zoster Vaccines- Shingrix (1 of 2) Never done   TETANUS/TDAP  11/19/2019   COVID-19 Vaccine (4 - Booster for Pfizer series) 07/27/2020    There are no preventive  care reminders to display for this patient.   Lab Results  Component Value  Date   TSH 0.75 08/01/2020   Lab Results  Component Value Date   WBC 6.9 08/01/2020   HGB 16.2 08/01/2020   HCT 48.1 08/01/2020   MCV 90.1 08/01/2020   PLT 263.0 08/01/2020   Lab Results  Component Value Date   NA 140 08/01/2020   K 4.5 08/01/2020   CO2 31 08/01/2020   GLUCOSE 99 08/01/2020   BUN 10 08/01/2020   CREATININE 1.03 08/01/2020   BILITOT 0.6 08/01/2020   ALKPHOS 105 08/01/2020   AST 16 08/01/2020   ALT 14 08/01/2020   PROT 6.7 08/01/2020   ALBUMIN 4.1 08/01/2020   CALCIUM 9.3 08/01/2020   GFR 75.60 08/01/2020   Lab Results  Component Value Date   CHOL 182 08/01/2020   Lab Results  Component Value Date   HDL 39.40 08/01/2020   Lab Results  Component Value Date   LDLCALC 114 (H) 08/01/2020   Lab Results  Component Value Date   TRIG 143.0 08/01/2020   Lab Results  Component Value Date   CHOLHDL 5 08/01/2020   Lab Results  Component Value Date   HGBA1C 5.5 10/29/2017       Assessment & Plan:   Problem List Items Addressed This Visit       Respiratory   Upper respiratory tract infection    Mild to moderate given length of symptoms without resolution we will go ahead and treat patient with antibiotics.  Discussed this with him follow-up if no improvement. Start doxycycline 100 mg twice daily for 7 days      Relevant Medications   doxycycline (VIBRA-TABS) 100 MG tablet     Musculoskeletal and Integument   Dermatitis - Primary    Discussed with patient about lowering temperature showers when he dries pat the area dry and he is moisturization.  We will send in Kenalog 0.1% to use on area did discuss precautions with topical steroid use and length of time.  Patient acknowledged we will follow-up if symptoms are not improved      Relevant Medications   triamcinolone cream (KENALOG) 0.1 %     No orders of the defined types were placed in this encounter.  This  visit occurred during the SARS-CoV-2 public health emergency.  Safety protocols were in place, including screening questions prior to the visit, additional usage of staff PPE, and extensive cleaning of exam room while observing appropriate contact time as indicated for disinfecting solutions.   Romilda Garret, NP

## 2021-05-21 NOTE — Assessment & Plan Note (Signed)
Discussed with patient about lowering temperature showers when he dries pat the area dry and he is moisturization.  We will send in Kenalog 0.1% to use on area did discuss precautions with topical steroid use and length of time.  Patient acknowledged we will follow-up if symptoms are not improved

## 2021-05-21 NOTE — Patient Instructions (Signed)
Nice to see you today Since symptoms have been going on three weeks will treat with antibiotic You can get over the counter mucinex to help thin out secretions, speak with pharmacist Follow up if symptoms fail to improve or worsen

## 2021-05-28 ENCOUNTER — Other Ambulatory Visit: Payer: Self-pay

## 2021-05-28 ENCOUNTER — Ambulatory Visit (INDEPENDENT_AMBULATORY_CARE_PROVIDER_SITE_OTHER): Payer: BC Managed Care – PPO

## 2021-05-28 ENCOUNTER — Other Ambulatory Visit: Payer: Self-pay | Admitting: Internal Medicine

## 2021-05-28 DIAGNOSIS — E538 Deficiency of other specified B group vitamins: Secondary | ICD-10-CM | POA: Diagnosis not present

## 2021-05-28 MED ORDER — CYANOCOBALAMIN 1000 MCG/ML IJ SOLN
1000.0000 ug | Freq: Once | INTRAMUSCULAR | Status: AC
  Administered 2021-05-28: 1000 ug via INTRAMUSCULAR

## 2021-05-28 NOTE — Progress Notes (Signed)
Pt was given B12 w/o any complications. 

## 2021-06-27 ENCOUNTER — Other Ambulatory Visit: Payer: Self-pay

## 2021-06-27 ENCOUNTER — Ambulatory Visit (INDEPENDENT_AMBULATORY_CARE_PROVIDER_SITE_OTHER): Payer: BC Managed Care – PPO

## 2021-06-27 DIAGNOSIS — E538 Deficiency of other specified B group vitamins: Secondary | ICD-10-CM | POA: Diagnosis not present

## 2021-06-27 NOTE — Progress Notes (Signed)
Pt here for monthly B12 injection per Dr. Alain Marion  B12 1058mcg given IM, and pt tolerated injection well.

## 2021-07-30 ENCOUNTER — Ambulatory Visit: Payer: BC Managed Care – PPO

## 2021-08-07 ENCOUNTER — Ambulatory Visit: Payer: BC Managed Care – PPO

## 2021-08-11 ENCOUNTER — Ambulatory Visit: Payer: BC Managed Care – PPO | Admitting: Internal Medicine

## 2021-08-14 ENCOUNTER — Other Ambulatory Visit: Payer: Self-pay

## 2021-08-14 ENCOUNTER — Ambulatory Visit (INDEPENDENT_AMBULATORY_CARE_PROVIDER_SITE_OTHER): Payer: BC Managed Care – PPO

## 2021-08-14 DIAGNOSIS — E538 Deficiency of other specified B group vitamins: Secondary | ICD-10-CM | POA: Diagnosis not present

## 2021-08-14 MED ORDER — CYANOCOBALAMIN 1000 MCG/ML IJ SOLN
1000.0000 ug | Freq: Once | INTRAMUSCULAR | Status: AC
  Administered 2021-08-14: 1000 ug via INTRAMUSCULAR

## 2021-08-14 NOTE — Progress Notes (Addendum)
Pt was given B12 w/o any complications. Medical screening examination/treatment/procedure(s) were performed by non-physician practitioner and as supervising physician I was immediately available for consultation/collaboration.  I agree with above. Aleksei Plotnikov, MD 

## 2021-09-03 ENCOUNTER — Encounter: Payer: BC Managed Care – PPO | Admitting: Internal Medicine

## 2021-09-10 ENCOUNTER — Other Ambulatory Visit: Payer: Self-pay

## 2021-09-10 ENCOUNTER — Ambulatory Visit (INDEPENDENT_AMBULATORY_CARE_PROVIDER_SITE_OTHER): Payer: BC Managed Care – PPO

## 2021-09-10 DIAGNOSIS — E538 Deficiency of other specified B group vitamins: Secondary | ICD-10-CM | POA: Diagnosis not present

## 2021-09-10 NOTE — Progress Notes (Addendum)
Pt here for monthly B12 injection per Dr. Alain Marion ? ?B12 1063mg given IM, and pt tolerated injection well. ? ?Next B12 injection scheduled for 10/13/21 ? ?Medical screening examination/treatment/procedure(s) were performed by non-physician practitioner and as supervising physician I was immediately available for consultation/collaboration.  I agree with above. ALew Dawes MD ?

## 2021-09-15 ENCOUNTER — Ambulatory Visit: Payer: BC Managed Care – PPO

## 2021-09-16 ENCOUNTER — Encounter: Payer: BC Managed Care – PPO | Admitting: Internal Medicine

## 2021-10-02 ENCOUNTER — Encounter: Payer: BC Managed Care – PPO | Admitting: Internal Medicine

## 2021-10-13 ENCOUNTER — Ambulatory Visit: Payer: BC Managed Care – PPO

## 2021-10-20 ENCOUNTER — Telehealth: Payer: Self-pay | Admitting: Internal Medicine

## 2021-10-20 DIAGNOSIS — Z0000 Encounter for general adult medical examination without abnormal findings: Secondary | ICD-10-CM

## 2021-10-20 DIAGNOSIS — E538 Deficiency of other specified B group vitamins: Secondary | ICD-10-CM

## 2021-10-20 NOTE — Telephone Encounter (Signed)
Pt requesting order for labs prior to cpe appt on 11-27-2021 ?

## 2021-10-21 ENCOUNTER — Encounter: Payer: BC Managed Care – PPO | Admitting: Internal Medicine

## 2021-10-21 NOTE — Telephone Encounter (Signed)
Called pt there was no answer LMOM MD ok labs prior... lab appt is 11/17/21.Marland KitchenJohny Chess ?

## 2021-10-21 NOTE — Telephone Encounter (Signed)
Okay.  Done.  Thanks 

## 2021-11-12 ENCOUNTER — Ambulatory Visit: Payer: BC Managed Care – PPO | Admitting: Internal Medicine

## 2021-11-12 ENCOUNTER — Encounter: Payer: Self-pay | Admitting: Internal Medicine

## 2021-11-12 VITALS — BP 120/74 | HR 65 | Temp 98.1°F | Ht 68.0 in | Wt 217.0 lb

## 2021-11-12 DIAGNOSIS — J309 Allergic rhinitis, unspecified: Secondary | ICD-10-CM

## 2021-11-12 DIAGNOSIS — J069 Acute upper respiratory infection, unspecified: Secondary | ICD-10-CM | POA: Diagnosis not present

## 2021-11-12 DIAGNOSIS — I10 Essential (primary) hypertension: Secondary | ICD-10-CM

## 2021-11-12 DIAGNOSIS — E739 Lactose intolerance, unspecified: Secondary | ICD-10-CM

## 2021-11-12 MED ORDER — DOXYCYCLINE HYCLATE 100 MG PO TABS: 100.0000 mg | ORAL_TABLET | Freq: Two times a day (BID) | ORAL | 0 refills | Status: DC

## 2021-11-12 NOTE — Patient Instructions (Signed)
Please take all new medication as prescribed - the antibiotic ? ?You should also take Xyzal 5 mg per day, and Nasacort and even Mucinex DM until improved ? ?Please call if you would like a short course of prednisone as well ? ?Please continue all other medications as before, and refills have been done if requested. ? ?Please have the pharmacy call with any other refills you may need. ? ?Please keep your appointments with your specialists as you may have planned ? ?We have discussed the Cardiac CT Score test to measure the calcification level (if any) in your heart arteries.  This test has been ordered in our [place], so please call [facility] directly, as they prefer this, at [phone] to be scheduled. ? ? ? ? ? ?

## 2021-11-12 NOTE — Progress Notes (Addendum)
Patient ID: William Acosta, male   DOB: [birth year], 66 y.o.   MRN: [id number]        Chief Complaint: follow up ear and sinus symptoms       HPI:  William Acosta is a 66 y.o. male here with c/o 2-3 wks osnet bilateral ear pressure, sinus pain and pressure, colored mucous, and small nosebleeds, runny nose, dry cough to start but now productive worse in the afternoons for some reason, , but also worse first thing in the AM with congestion he has to clear.  William Acosta, then William for the past 4 days not really helped.  Pt denies chest pain, increased sob or doe, wheezing, orthopnea, PND, increased LE swelling, palpitations, dizziness or syncope, but interested in cardiac risk score testing.   Pt denies polydipsia, polyuria, or new focal neuro s/s.    Pt denies recent wt loss, night sweats, loss of appetite, or other constitutional symptoms       Wt Readings from Last 3 Encounters:  11/12/21 217 lb (98.4 kg)  05/21/21 227 lb 2 oz (103 kg)  07/31/20 221 lb 6.4 oz (100.4 kg)   BP Readings from Last 3 Encounters:  11/12/21 120/74  05/21/21 118/72  07/31/20 132/70         Past Medical History:  Diagnosis Date   Allergic rhinitis    Allergy    Arthritis    Cancer ([place])    skin melanoma on right leg 2010   Depression    Diverticulosis    GERD (gastroesophageal reflux disease)    Hyperlipidemia    Hypertension    Multinodular goiter    Past Surgical History:  Procedure Laterality Date   SALIVARY GLAND SURGERY     Right    reports that he quit smoking about 36 years ago. His smoking use included cigarettes. He has never used smokeless tobacco. He reports current alcohol use. He reports that he does not use drugs. family history includes Breast cancer in his maternal grandmother; Diabetes in an other family member; Heart attack in his father; Heart disease in his father and another family member; Hypertension in his mother; Pancreatic cancer in his maternal  grandmother. Allergies  Allergen Reactions   Moxifloxacin     REACTION: felt bad   Current Outpatient Medications on File Prior to Visit  Medication Sig Dispense Refill   cyanocobalamin (,VITAMIN B-12,) 1000 MCG/ML injection Inject 1,000 mcg into the muscle every 30 (thirty) days.     levocetirizine (XYZAL) 5 MG tablet Take 5 mg by mouth every evening.     telmisartan (MICARDIS) 80 MG tablet TAKE 1 TABLET (80 MG TOTAL) BY MOUTH DAILY. 90 tablet 3   triamcinolone cream (KENALOG) 0.1 % Apply 1 application topically 2 (two) times daily. 30 g 0   No current facility-administered medications on file prior to visit.        ROS:  All others reviewed and negative.  Objective        PE:  BP 120/74 (BP Location: Right Arm, Patient Position: Sitting, Cuff Size: Large)   Pulse 65   Temp 98.1 F (36.7 C) (Oral)   Ht '5\' 8"'$  (1.727 m)   Wt 217 lb (98.4 kg)   SpO2 97%   BMI 32.99 kg/m                 Constitutional: Pt appears in NAD  HENT: Head: NCAT.                Right Ear: External ear normal.                 Left Ear: External ear normal.  Bilat tm's with mild erythema.  Max sinus areas mild tender.  Pharynx with mild erythema, no exudate                Eyes: . Pupils are equal, round, and reactive to light. Conjunctivae and EOM are normal               Nose: without d/c or deformity               Neck: Neck supple. Gross normal ROM               Cardiovascular: Normal rate and regular rhythm.                 Pulmonary/Chest: Effort normal and breath sounds without rales or wheezing.                Abd:  Soft, NT, ND, + BS, no organomegaly               Neurological: Pt is alert. At baseline orientation, motor grossly intact               Skin: Skin is warm. No rashes, no other new lesions, LE edema - none               Psychiatric: Pt behavior is normal without agitation   Micro: none  Cardiac tracings I have personally interpreted today:  none  Pertinent  Radiological findings (summarize): none   Lab Results  Component Value Date   WBC 9.3 11/13/2021   HGB 15.6 11/13/2021   HCT 45.7 11/13/2021   PLT 277.0 11/13/2021   GLUCOSE 101 (H) 11/13/2021   CHOL 167 11/13/2021   TRIG 98.0 11/13/2021   HDL 35.00 (L) 11/13/2021   LDLCALC 112 (H) 11/13/2021   ALT 13 11/13/2021   AST 16 11/13/2021   NA 140 11/13/2021   K 4.4 11/13/2021   CL 106 11/13/2021   CREATININE 1.02 11/13/2021   BUN 9 11/13/2021   CO2 27 11/13/2021   TSH 0.30 (L) 11/13/2021   PSA 4.25 (H) 11/13/2021   HGBA1C 5.5 10/29/2017   Assessment/Plan:  William Acosta is a 66 y.o. White or Caucasian [1] male with  has a past medical history of Allergic rhinitis, Allergy, Arthritis, Cancer ([place]), Depression, Diverticulosis, GERD (gastroesophageal reflux disease), Hyperlipidemia, Hypertension, and Multinodular goiter.  GLUCOSE INTOLERANCE Lab Results  Component Value Date   HGBA1C 5.5 10/29/2017   Stable, pt to continue current medical treatment  - diet   Hypertension BP Readings from Last 3 Encounters:  11/12/21 120/74  05/21/21 118/72  07/31/20 132/70   Stable, pt to continue medical treatment micardis, also for cardiac CT score  Allergic rhinitis Mild to mod, for restart xyzal and nasacort asd,  to f/u any worsening symptoms or concerns  Upper respiratory tract infection Mild to mod, for antibx course,  to f/u any worsening symptoms or concerns  Followup: Return if symptoms worsen or fail to improve.  Cathlean Cower, MD 11/15/2021 9:15 PM [facility]

## 2021-11-13 ENCOUNTER — Other Ambulatory Visit (INDEPENDENT_AMBULATORY_CARE_PROVIDER_SITE_OTHER): Payer: BC Managed Care – PPO

## 2021-11-13 DIAGNOSIS — Z0000 Encounter for general adult medical examination without abnormal findings: Secondary | ICD-10-CM

## 2021-11-13 DIAGNOSIS — E538 Deficiency of other specified B group vitamins: Secondary | ICD-10-CM

## 2021-11-13 DIAGNOSIS — Z125 Encounter for screening for malignant neoplasm of prostate: Secondary | ICD-10-CM

## 2021-11-13 LAB — COMPREHENSIVE METABOLIC PANEL
ALT: 13 U/L (ref 0–53)
AST: 16 U/L (ref 0–37)
Albumin: 4 g/dL (ref 3.5–5.2)
Alkaline Phosphatase: 117 U/L (ref 39–117)
BUN: 9 mg/dL (ref 6–23)
CO2: 27 mEq/L (ref 19–32)
Calcium: 9 mg/dL (ref 8.4–10.5)
Chloride: 106 mEq/L (ref 96–112)
Creatinine, Ser: 1.02 mg/dL (ref 0.40–1.50)
GFR: 75.8 mL/min (ref >60.00)
Glucose, Bld: 101 mg/dL — ABNORMAL HIGH (ref 70–99)
Potassium: 4.4 mEq/L (ref 3.5–5.1)
Sodium: 140 mEq/L (ref 135–145)
Total Bilirubin: 0.6 mg/dL (ref 0.2–1.2)
Total Protein: 6.5 g/dL (ref 6.0–8.3)

## 2021-11-13 LAB — CBC WITH DIFFERENTIAL/PLATELET
Basophils Absolute: 0.1 10*3/uL (ref 0.0–0.1)
Basophils Relative: 0.6 % (ref 0.0–3.0)
Eosinophils Absolute: 0.2 10*3/uL (ref 0.0–0.7)
Eosinophils Relative: 1.7 % (ref 0.0–5.0)
HCT: 45.7 % (ref 39.0–52.0)
Hemoglobin: 15.6 g/dL (ref 13.0–17.0)
Lymphocytes Relative: 29.3 % (ref 12.0–46.0)
Lymphs Abs: 2.7 10*3/uL (ref 0.7–4.0)
MCHC: 34.2 g/dL (ref 30.0–36.0)
MCV: 89.2 fl (ref 78.0–100.0)
Monocytes Absolute: 0.7 10*3/uL (ref 0.1–1.0)
Monocytes Relative: 7 % (ref 3.0–12.0)
Neutro Abs: 5.7 10*3/uL (ref 1.4–7.7)
Neutrophils Relative %: 61.4 % (ref 43.0–77.0)
Platelets: 277 10*3/uL (ref 150.0–400.0)
RBC: 5.12 Mil/uL (ref 4.22–5.81)
RDW: 12.7 % (ref 11.5–15.5)
WBC: 9.3 10*3/uL (ref 4.0–10.5)

## 2021-11-13 LAB — URINALYSIS
Bilirubin Urine: NEGATIVE
Hgb urine dipstick: NEGATIVE
Ketones, ur: NEGATIVE
Leukocytes,Ua: NEGATIVE
Nitrite: NEGATIVE
Specific Gravity, Urine: 1.01 (ref 1.000–1.030)
Total Protein, Urine: NEGATIVE
Urine Glucose: NEGATIVE
Urobilinogen, UA: 0.2 (ref 0.0–1.0)
pH: 6 (ref 5.0–8.0)

## 2021-11-13 LAB — LIPID PANEL
Cholesterol: 167 mg/dL (ref 0–200)
HDL: 35 mg/dL — ABNORMAL LOW (ref >39.00)
LDL Cholesterol: 112 mg/dL — ABNORMAL HIGH (ref 0–99)
NonHDL: 131.61
Total CHOL/HDL Ratio: 5
Triglycerides: 98 mg/dL (ref 0.0–149.0)
VLDL: 19.6 mg/dL (ref 0.0–40.0)

## 2021-11-13 LAB — VITAMIN B12: Vitamin B-12: 342 pg/mL (ref 211–911)

## 2021-11-13 LAB — PSA: PSA: 4.25 ng/mL — ABNORMAL HIGH (ref 0.10–4.00)

## 2021-11-13 LAB — TSH: TSH: 0.3 u[IU]/mL — ABNORMAL LOW (ref 0.35–5.50)

## 2021-11-15 ENCOUNTER — Encounter: Payer: Self-pay | Admitting: Internal Medicine

## 2021-11-15 NOTE — Assessment & Plan Note (Addendum)
BP Readings from Last 3 Encounters:  11/12/21 120/74  05/21/21 118/72  07/31/20 132/70   Stable, pt to continue medical treatment micardis, also for cardiac CT score

## 2021-11-15 NOTE — Assessment & Plan Note (Signed)
Mild to mod, for antibx course,  to f/u any worsening symptoms or concerns 

## 2021-11-15 NOTE — Assessment & Plan Note (Signed)
Lab Results  Component Value Date   HGBA1C 5.5 10/29/2017   Stable, pt to continue current medical treatment  - diet

## 2021-11-15 NOTE — Assessment & Plan Note (Signed)
Mild to mod, for restart xyzal and nasacort asd,  to f/u any worsening symptoms or concerns

## 2021-11-17 ENCOUNTER — Telehealth: Payer: Self-pay | Admitting: Internal Medicine

## 2021-11-17 ENCOUNTER — Other Ambulatory Visit: Payer: BC Managed Care – PPO

## 2021-11-17 NOTE — Telephone Encounter (Signed)
Connected to [facility] 5.15.2023.  Caller states he has been sick for about two weeks with nasal congestion with a runny nose and a cough. He has back pain from coughing.  Nurse attempted to call pt back. No response. Unknows outcome.

## 2021-11-27 ENCOUNTER — Ambulatory Visit (INDEPENDENT_AMBULATORY_CARE_PROVIDER_SITE_OTHER): Payer: BC Managed Care – PPO | Admitting: Internal Medicine

## 2021-11-27 ENCOUNTER — Encounter: Payer: Self-pay | Admitting: Internal Medicine

## 2021-11-27 VITALS — BP 130/80 | HR 81 | Temp 98.2°F | Ht 68.0 in | Wt 210.0 lb

## 2021-11-27 DIAGNOSIS — F329 Major depressive disorder, single episode, unspecified: Secondary | ICD-10-CM

## 2021-11-27 DIAGNOSIS — R972 Elevated prostate specific antigen [PSA]: Secondary | ICD-10-CM | POA: Diagnosis not present

## 2021-11-27 DIAGNOSIS — Z0000 Encounter for general adult medical examination without abnormal findings: Secondary | ICD-10-CM

## 2021-11-27 DIAGNOSIS — R7989 Other specified abnormal findings of blood chemistry: Secondary | ICD-10-CM

## 2021-11-27 DIAGNOSIS — E538 Deficiency of other specified B group vitamins: Secondary | ICD-10-CM | POA: Diagnosis not present

## 2021-11-27 DIAGNOSIS — R739 Hyperglycemia, unspecified: Secondary | ICD-10-CM

## 2021-11-27 MED ORDER — CYANOCOBALAMIN 1000 MCG/ML IJ SOLN
1000.0000 ug | Freq: Once | INTRAMUSCULAR | Status: AC
  Administered 2021-11-27: 1000 ug via INTRAMUSCULAR

## 2021-11-27 NOTE — Progress Notes (Unsigned)
Subjective:  Patient ID: William Acosta, male    DOB: October 05, 1955  Age: 66 y.o. MRN: [id number]  CC: No chief complaint on file.   HPI Doron Shake Hueber presents for a well exam  Outpatient Medications Prior to Visit  Medication Sig Dispense Refill   cyanocobalamin (,VITAMIN B-12,) 1000 MCG/ML injection Inject 1,000 mcg into the muscle every 30 (thirty) days.     levocetirizine (XYZAL) 5 MG tablet Take 5 mg by mouth every evening.     telmisartan (MICARDIS) 80 MG tablet TAKE 1 TABLET (80 MG TOTAL) BY MOUTH DAILY. 90 tablet 3   triamcinolone cream (KENALOG) 0.1 % Apply 1 application topically 2 (two) times daily. 30 g 0   doxycycline (VIBRA-TABS) 100 MG tablet Take 1 tablet (100 mg total) by mouth 2 (two) times daily. 20 tablet 0   No facility-administered medications prior to visit.    ROS: Review of Systems  Constitutional:  Negative for appetite change, fatigue and unexpected weight change.  HENT:  Negative for congestion, nosebleeds, sneezing, sore throat and trouble swallowing.   Eyes:  Negative for itching and visual disturbance.  Respiratory:  Negative for cough.   Cardiovascular:  Negative for chest pain, palpitations and leg swelling.  Gastrointestinal:  Negative for abdominal distention, blood in stool, diarrhea and nausea.  Genitourinary:  Negative for frequency and hematuria.  Musculoskeletal:  Negative for back pain, gait problem, joint swelling and neck pain.  Skin:  Negative for rash.  Neurological:  Negative for dizziness, tremors, speech difficulty and weakness.  Psychiatric/Behavioral:  Negative for agitation, dysphoric mood and sleep disturbance. The patient is not nervous/anxious.    Objective:  BP 130/80 (BP Location: Left Arm, Patient Position: Sitting, Cuff Size: Large)   Pulse 81   Temp 98.2 F (36.8 C) (Oral)   Ht '5\' 8"'$  (1.727 m)   Wt 210 lb (95.3 kg)   SpO2 96%   BMI 31.93 kg/m   BP Readings from Last 3 Encounters:  11/27/21 130/80  11/12/21  120/74  05/21/21 118/72    Wt Readings from Last 3 Encounters:  11/27/21 210 lb (95.3 kg)  11/12/21 217 lb (98.4 kg)  05/21/21 227 lb 2 oz (103 kg)    Physical Exam Constitutional:      General: He is not in acute distress.    Appearance: He is well-developed.     Comments: NAD  Eyes:     Conjunctiva/sclera: Conjunctivae normal.     Pupils: Pupils are equal, round, and reactive to light.  Neck:     Thyroid: No thyromegaly.     Vascular: No JVD.  Cardiovascular:     Rate and Rhythm: Normal rate and regular rhythm.     Heart sounds: Normal heart sounds. No murmur heard.   No friction rub. No gallop.  Pulmonary:     Effort: Pulmonary effort is normal. No respiratory distress.     Breath sounds: Normal breath sounds. No wheezing or rales.  Chest:     Chest wall: No tenderness.  Abdominal:     General: Bowel sounds are normal. There is no distension.     Palpations: Abdomen is soft. There is no mass.     Tenderness: There is no abdominal tenderness. There is no guarding or rebound.  Musculoskeletal:        General: No tenderness. Normal range of motion.     Cervical back: Normal range of motion.  Lymphadenopathy:     Cervical: No cervical adenopathy.  Skin:  General: Skin is warm and dry.     Findings: No rash.  Neurological:     Mental Status: He is alert and oriented to person, place, and time.     Cranial Nerves: No cranial nerve deficit.     Motor: No abnormal muscle tone.     Coordination: Coordination normal.     Gait: Gait normal.     Deep Tendon Reflexes: Reflexes are normal and symmetric.  Psychiatric:        Behavior: Behavior normal.        Thought Content: Thought content normal.        Judgment: Judgment normal.   Rectal - per GI   Lab Results  Component Value Date   WBC 9.3 11/13/2021   HGB 15.6 11/13/2021   HCT 45.7 11/13/2021   PLT 277.0 11/13/2021   GLUCOSE 101 (H) 11/13/2021   CHOL 167 11/13/2021   TRIG 98.0 11/13/2021   HDL 35.00 (L)  11/13/2021   LDLCALC 112 (H) 11/13/2021   ALT 13 11/13/2021   AST 16 11/13/2021   NA 140 11/13/2021   K 4.4 11/13/2021   CL 106 11/13/2021   CREATININE 1.02 11/13/2021   BUN 9 11/13/2021   CO2 27 11/13/2021   TSH 0.30 (L) 11/13/2021   PSA 4.25 (H) 11/13/2021   HGBA1C 5.5 10/29/2017    No results found.  Assessment & Plan:   Problem List Items Addressed This Visit     B12 deficiency - Primary      Meds ordered this encounter  Medications   cyanocobalamin ((VITAMIN B-12)) injection 1,000 mcg      Follow-up: No follow-ups on file.  Walker Kehr, MD

## 2021-11-27 NOTE — Assessment & Plan Note (Signed)
We will obtain free PSA in 3 months Start saw palmetto daily

## 2021-11-27 NOTE — Patient Instructions (Addendum)
Saw Palmetto    Sign up for [employer] ( via [employer] on [employer] or your ipad). If you don't have a Art therapist card  - go to [employer] branch. They will set you up in 15 minutes. It is free. You can check out books to read and to listen, check out magazines and newspapers, movies etc.   The Obesity Code book by Sharman Cheek   These suggestions will probably help you to improve your metabolism if you are not overweight and to lose weight if you are overweight: 1.  Reduce your consumption of sugars and starches.  Eliminate high fructose corn syrup from your diet.  Reduce your consumption of processed foods.  For desserts try to have seasonal fruits, berries, nuts, cheeses or dark chocolate with more than 70% cacao. 2.  Do not snack 3.  You do not have to eat breakfast.  If you choose to have breakfast - eat plain greek yogurt, eggs, oatmeal (without sugar) - use honey if you need to. 4.  Drink water, freshly brewed unsweetened tea (green, black or herbal) or coffee.  Do not drink sodas including diet sodas , juices, beverages sweetened with artificial sweeteners. 5.  Reduce your consumption of refined grains. 6.  Avoid protein drinks such as Optifast, Slim fast etc. Eat chicken, fish, meat, dairy and beans for your sources of protein. 7.  Natural unprocessed fats like cold pressed virgin olive oil, butter, coconut oil are good for you.  Eat avocados. 8.  Increase your consumption of fiber.  Fruits, berries, vegetables, whole grains, flaxseed, chia seeds, beans, popcorn, nuts, oatmeal are good sources of fiber 9.  Use vinegar in your diet, i.e. apple cider vinegar, red wine or balsamic vinegar 10.  You can try fasting.  For example you can skip breakfast and lunch every other day (24-hour fast) 11.  Stress reduction, good night sleep, relaxation, meditation, yoga and other physical activity is likely to help you to maintain low weight too. 12.  If you drink alcohol, limit your alcohol  intake to no more than 2 drinks a day.

## 2021-11-27 NOTE — Assessment & Plan Note (Signed)

## 2021-11-29 NOTE — Assessment & Plan Note (Signed)
Doing well 

## 2022-01-05 ENCOUNTER — Inpatient Hospital Stay: Admission: RE | Admit: 2022-01-05 | Payer: BC Managed Care – PPO | Source: Ambulatory Visit

## 2022-01-26 ENCOUNTER — Encounter: Payer: Self-pay | Admitting: Internal Medicine

## 2022-01-26 ENCOUNTER — Ambulatory Visit: Payer: BC Managed Care – PPO | Admitting: Internal Medicine

## 2022-01-26 ENCOUNTER — Other Ambulatory Visit: Payer: Self-pay

## 2022-01-26 VITALS — BP 130/74 | HR 76 | Temp 98.2°F | Ht 68.0 in | Wt 212.0 lb

## 2022-01-26 DIAGNOSIS — R300 Dysuria: Secondary | ICD-10-CM

## 2022-01-26 DIAGNOSIS — I10 Essential (primary) hypertension: Secondary | ICD-10-CM | POA: Diagnosis not present

## 2022-01-26 DIAGNOSIS — N341 Nonspecific urethritis: Secondary | ICD-10-CM

## 2022-01-26 LAB — POC URINALSYSI DIPSTICK (AUTOMATED)
Bilirubin, UA: NEGATIVE
Glucose, UA: NEGATIVE
Ketones, UA: NEGATIVE
Leukocytes, UA: NEGATIVE
Nitrite, UA: NEGATIVE
Protein, UA: POSITIVE — AB
Spec Grav, UA: 1.02 (ref 1.010–1.025)
Urobilinogen, UA: 0.2 E.U./dL
pH, UA: 6 (ref 5.0–8.0)

## 2022-01-26 MED ORDER — SULFAMETHOXAZOLE-TRIMETHOPRIM 800-160 MG PO TABS
1.0000 | ORAL_TABLET | Freq: Two times a day (BID) | ORAL | 0 refills | Status: AC
Start: 2022-01-26 — End: 2022-02-02

## 2022-01-26 NOTE — Assessment & Plan Note (Signed)
Acute Symptoms consistent with urethritis, possible prostatitis Urine dip here shows some protein, but no obvious infection Send urine for culture No risk for STD We will treat empirically with Bactrim DS 800-160 milligram twice daily x7 days Increase fluids We will see what culture shows Advised that if symptoms did not improve he may need to see a urologist

## 2022-01-26 NOTE — Progress Notes (Signed)
Subjective:    Patient ID: William Acosta, male    DOB: 23-Sep-1955, 66 y.o.   MRN: [id number]      HPI William Acosta is here for  Chief Complaint  Patient presents with   Urinary Tract Infection    Left sided back pain with frequent urination; Patient also notes mild type discharge (at times); Patient woke up sweaty last night and states that sometimes he feels a slight itcy sensation inside penis area.     4-5 days of symptoms - slowly building up.  Nocuturia 6-7 times last night.  He typically does not go that much at night.  He would just go to the bathroom and then feel like he still has to go.  He has noticed a little bit of urgency.  He states a feeling of discomfort in the urethra and sometimes the pain goes from the base of the urethra to the tip of the urethra.  There is some discomfort in the urethra.  It is a little weaker stream.  This feels similar to what he had years ago-nonspecific urethritis.   No std risk. No risk now-Not currently sexually active.  He had a little bit of lower back pain, but feels that that was related to something physical he was doing over the weekend.  No history of renal stones.  No blood in the stool.   Medications and allergies reviewed with patient and updated if appropriate.  Current Outpatient Medications on File Prior to Visit  Medication Sig Dispense Refill   cyanocobalamin (,VITAMIN B-12,) 1000 MCG/ML injection Inject 1,000 mcg into the muscle every 30 (thirty) days.     levocetirizine (XYZAL) 5 MG tablet Take 5 mg by mouth every evening.     telmisartan (MICARDIS) 80 MG tablet TAKE 1 TABLET (80 MG TOTAL) BY MOUTH DAILY. 90 tablet 3   triamcinolone cream (KENALOG) 0.1 % Apply 1 application topically 2 (two) times daily. 30 g 0   No current facility-administered medications on file prior to visit.    Review of Systems  Constitutional:  Positive for diaphoresis (last night a little). Negative for chills and fever.  Gastrointestinal:   Negative for nausea.  Genitourinary:  Positive for difficulty urinating, dysuria (at tip of urethra), frequency and urgency. Negative for hematuria and penile discharge.  Musculoskeletal:  Positive for back pain (left lower back).  Neurological:  Negative for light-headedness and headaches.       Objective:   Vitals:   01/26/22 0944  BP: 130/74  Pulse: 76  Temp: 98.2 F (36.8 C)  SpO2: 98%   BP Readings from Last 3 Encounters:  01/26/22 130/74  11/27/21 130/80  11/12/21 120/74   Wt Readings from Last 3 Encounters:  01/26/22 212 lb (96.2 kg)  11/27/21 210 lb (95.3 kg)  11/12/21 217 lb (98.4 kg)   Body mass index is 32.23 kg/m.    Physical Exam Constitutional:      General: He is not in acute distress.    Appearance: Normal appearance. He is not ill-appearing.  HENT:     Head: Normocephalic and atraumatic.  Abdominal:     General: There is no distension.     Palpations: Abdomen is soft.     Tenderness: There is no abdominal tenderness. There is no right CVA tenderness, guarding or rebound.  Skin:    General: Skin is warm and dry.     Findings: No rash.  Neurological:     Mental Status: He is alert.  Assessment & Plan:    See Problem List for Assessment and Plan of chronic medical problems.

## 2022-01-26 NOTE — Patient Instructions (Addendum)
     Medications changes include :   Bactrim twice daily x 1 week   Your prescription(s) have been sent to your pharmacy.     Return if symptoms worsen or fail to improve.

## 2022-01-26 NOTE — Assessment & Plan Note (Signed)
Chronic Blood pressure well controlled here today-he did ask if he should consider going down on his dose of medication since his blood pressure is so well Blood pressure is well controlled-recommended continuing current dose of medication Continue Micardis 80 mg daily

## 2022-01-29 ENCOUNTER — Ambulatory Visit: Payer: BC Managed Care – PPO | Admitting: Internal Medicine

## 2022-01-29 LAB — CULTURE, URINE COMPREHENSIVE

## 2022-01-29 MED ORDER — NITROFURANTOIN MONOHYD MACRO 100 MG PO CAPS: 100.0000 mg | ORAL_CAPSULE | Freq: Two times a day (BID) | ORAL | 0 refills | Status: DC

## 2022-01-29 NOTE — Addendum Note (Signed)
Addended by: Binnie Rail on: 01/29/2022 02:29 PM   Modules accepted: Orders

## 2022-02-26 ENCOUNTER — Ambulatory Visit (INDEPENDENT_AMBULATORY_CARE_PROVIDER_SITE_OTHER): Payer: BC Managed Care – PPO

## 2022-02-26 DIAGNOSIS — E538 Deficiency of other specified B group vitamins: Secondary | ICD-10-CM

## 2022-02-26 MED ORDER — CYANOCOBALAMIN 1000 MCG/ML IJ SOLN
1000.0000 ug | INTRAMUSCULAR | Status: AC
  Administered 2022-02-26 – 2022-03-27 (×2): 1000 ug via INTRAMUSCULAR

## 2022-02-26 NOTE — Progress Notes (Cosign Needed Addendum)
B12 given.  Pt tolerated well. Pt is aware to give the office a call for an side effects or reactions. Please co-sign.   Medical screening examination/treatment/procedure(s) were performed by non-physician practitioner and as supervising physician I was immediately available for consultation/collaboration.  I agree with above. Aleksei Plotnikov, MD 

## 2022-03-27 ENCOUNTER — Ambulatory Visit (INDEPENDENT_AMBULATORY_CARE_PROVIDER_SITE_OTHER): Payer: BC Managed Care – PPO

## 2022-03-27 ENCOUNTER — Ambulatory Visit: Payer: BC Managed Care – PPO

## 2022-03-27 DIAGNOSIS — E538 Deficiency of other specified B group vitamins: Secondary | ICD-10-CM

## 2022-03-27 NOTE — Progress Notes (Signed)
B12 given.  Pt tolerated well. Pt is aware to give the office a call for an side effects or reactions. Please co-sign.   

## 2022-04-27 ENCOUNTER — Ambulatory Visit: Payer: BC Managed Care – PPO

## 2022-05-01 ENCOUNTER — Ambulatory Visit (INDEPENDENT_AMBULATORY_CARE_PROVIDER_SITE_OTHER): Payer: BC Managed Care – PPO

## 2022-05-01 DIAGNOSIS — E538 Deficiency of other specified B group vitamins: Secondary | ICD-10-CM | POA: Diagnosis not present

## 2022-05-01 MED ORDER — CYANOCOBALAMIN 1000 MCG/ML IJ SOLN
1000.0000 ug | Freq: Once | INTRAMUSCULAR | Status: AC
  Administered 2022-05-01: 1000 ug via INTRAMUSCULAR

## 2022-05-01 NOTE — Progress Notes (Addendum)
After obtaining consent, and per orders of Dr. Alain Marion, injection of B12 was given in the left deltoid by Marrian Salvage. Patient instructed to report any adverse reaction to me immediately.  Medical screening examination/treatment/procedure(s) were performed by non-physician practitioner and as supervising physician I was immediately available for consultation/collaboration.  I agree with above. Lew Dawes, MD

## 2022-05-07 ENCOUNTER — Ambulatory Visit: Payer: BC Managed Care – PPO

## 2022-05-28 ENCOUNTER — Encounter: Payer: Self-pay | Admitting: Internal Medicine

## 2022-05-28 ENCOUNTER — Ambulatory Visit: Payer: BC Managed Care – PPO | Admitting: Internal Medicine

## 2022-05-28 VITALS — BP 138/78 | HR 85 | Temp 97.9°F | Ht 68.0 in | Wt 219.0 lb

## 2022-05-28 DIAGNOSIS — N451 Epididymitis: Secondary | ICD-10-CM

## 2022-05-28 DIAGNOSIS — I10 Essential (primary) hypertension: Secondary | ICD-10-CM

## 2022-05-28 DIAGNOSIS — E538 Deficiency of other specified B group vitamins: Secondary | ICD-10-CM | POA: Diagnosis not present

## 2022-05-28 MED ORDER — MELOXICAM 15 MG PO TABS: 15.0000 mg | ORAL_TABLET | Freq: Every day | ORAL | 1 refills | Status: DC

## 2022-05-28 MED ORDER — ALPRAZOLAM 0.25 MG PO TABS: 0.2500 mg | ORAL_TABLET | Freq: Two times a day (BID) | ORAL | 2 refills | Status: DC | PRN

## 2022-05-28 MED ORDER — DOXYCYCLINE HYCLATE 100 MG PO TABS: 100.0000 mg | ORAL_TABLET | Freq: Two times a day (BID) | ORAL | 0 refills | Status: DC

## 2022-05-28 MED ORDER — TELMISARTAN 80 MG PO TABS
80.0000 mg | ORAL_TABLET | Freq: Every day | ORAL | 3 refills | Status: DC
Start: 2022-05-28 — End: 2022-06-18

## 2022-05-28 NOTE — Assessment & Plan Note (Signed)
R side Support briefs Doxy x 2 wks Meloxicam 15 mg po qd

## 2022-05-28 NOTE — Progress Notes (Signed)
Subjective:  Patient ID: William Acosta, male    DOB: [birth year]  Age: 66 y.o. MRN: [id number]  CC: Follow-up   HPI Erasmo Vertz Oriol presents for HTN, stress w/mother,  C/o L testis pain  Outpatient Medications Prior to Visit  Medication Sig Dispense Refill   cyanocobalamin (,VITAMIN B-12,) 1000 MCG/ML injection Inject 1,000 mcg into the muscle every 30 (thirty) days.     nitrofurantoin, macrocrystal-monohydrate, (MACROBID) 100 MG capsule Take 1 capsule (100 mg total) by mouth 2 (two) times daily. 14 capsule 0   [facility] SUNSCREEN SPF 30 EX apply topically to face and body daily for 30 (Patient not taking: Reported on 05/28/2022)     levocetirizine (XYZAL) 5 MG tablet Take 5 mg by mouth every evening. (Patient not taking: Reported on 05/28/2022)     triamcinolone cream (KENALOG) 0.1 % Apply 1 application topically 2 (two) times daily. (Patient not taking: Reported on 05/28/2022) 30 g 0   telmisartan (MICARDIS) 80 MG tablet TAKE 1 TABLET (80 MG TOTAL) BY MOUTH DAILY. (Patient not taking: Reported on 05/28/2022) 90 tablet 3   Facility-Administered Medications Prior to Visit  Medication Dose Route Frequency Provider Last Rate Last Admin   cyanocobalamin (VITAMIN B12) injection 1,000 mcg  1,000 mcg Intramuscular Q30 days Oprah Camarena, Evie Lacks, MD   1,000 mcg at 03/27/22 1452    ROS: Review of Systems  Constitutional:  Negative for appetite change, fatigue and unexpected weight change.  HENT:  Negative for congestion, nosebleeds, sneezing, sore throat and trouble swallowing.   Eyes:  Negative for itching and visual disturbance.  Respiratory:  Negative for cough.   Cardiovascular:  Negative for chest pain, palpitations and leg swelling.  Gastrointestinal:  Negative for abdominal distention, blood in stool, diarrhea, nausea and vomiting.  Genitourinary:  Negative for frequency and hematuria.  Musculoskeletal:  Negative for back pain, gait problem, joint swelling and neck pain.  Skin:  Negative  for rash.  Neurological:  Negative for dizziness, tremors, speech difficulty and weakness.  Psychiatric/Behavioral:  Negative for agitation, dysphoric mood, sleep disturbance and suicidal ideas. The patient is not nervous/anxious.     Objective:  BP 138/78 (BP Location: Right Arm, Patient Position: Sitting, Cuff Size: Normal)   Pulse 85   Temp 97.9 F (36.6 C) (Oral)   Ht '5\' 8"'$  (1.727 m)   Wt 219 lb (99.3 kg)   SpO2 99%   BMI 33.30 kg/m   BP Readings from Last 3 Encounters:  05/28/22 138/78  01/26/22 130/74  11/27/21 130/80    Wt Readings from Last 3 Encounters:  05/28/22 219 lb (99.3 kg)  01/26/22 212 lb (96.2 kg)  11/27/21 210 lb (95.3 kg)    Physical Exam Constitutional:      General: He is not in acute distress.    Appearance: He is well-developed. He is obese. He is not ill-appearing.     Comments: NAD  Eyes:     Conjunctiva/sclera: Conjunctivae normal.     Pupils: Pupils are equal, round, and reactive to light.  Neck:     Thyroid: No thyromegaly.     Vascular: No JVD.  Cardiovascular:     Rate and Rhythm: Normal rate and regular rhythm.     Heart sounds: Normal heart sounds. No murmur heard.    No friction rub. No gallop.  Pulmonary:     Effort: Pulmonary effort is normal. No respiratory distress.     Breath sounds: Normal breath sounds. No wheezing or rales.  Chest:  Chest wall: No tenderness.  Abdominal:     General: Bowel sounds are normal. There is no distension.     Palpations: Abdomen is soft. There is no mass.     Tenderness: There is no abdominal tenderness. There is no guarding or rebound.  Musculoskeletal:        General: No tenderness. Normal range of motion.     Cervical back: Normal range of motion.     Right lower leg: No edema.     Left lower leg: No edema.  Lymphadenopathy:     Cervical: No cervical adenopathy.  Skin:    General: Skin is warm and dry.     Findings: No rash.  Neurological:     Mental Status: He is alert and  oriented to person, place, and time.     Cranial Nerves: No cranial nerve deficit.     Motor: No abnormal muscle tone.     Coordination: Coordination normal.     Gait: Gait normal.     Deep Tendon Reflexes: Reflexes are normal and symmetric.  Psychiatric:        Behavior: Behavior normal.        Thought Content: Thought content normal.        Judgment: Judgment normal.     Lab Results  Component Value Date   WBC 9.3 11/13/2021   HGB 15.6 11/13/2021   HCT 45.7 11/13/2021   PLT 277.0 11/13/2021   GLUCOSE 101 (H) 11/13/2021   CHOL 167 11/13/2021   TRIG 98.0 11/13/2021   HDL 35.00 (L) 11/13/2021   LDLCALC 112 (H) 11/13/2021   ALT 13 11/13/2021   AST 16 11/13/2021   NA 140 11/13/2021   K 4.4 11/13/2021   CL 106 11/13/2021   CREATININE 1.02 11/13/2021   BUN 9 11/13/2021   CO2 27 11/13/2021   TSH 0.30 (L) 11/13/2021   PSA 4.25 (H) 11/13/2021   HGBA1C 5.5 10/29/2017    No results found.  Assessment & Plan:   Problem List Items Addressed This Visit     Hypertension    Continue with telmisartan      Epididymitis - Primary    R side Support briefs Doxy x 2 wks Meloxicam 15 mg po qd      B12 deficiency    On B12         Meds ordered this encounter  Medications   DISCONTD: telmisartan (MICARDIS) 80 MG tablet    Sig: Take 1 tablet (80 mg total) by mouth daily.    Dispense:  90 tablet    Refill:  3    PATIENT RX HAS EXPIRED AND WOULD LIKE A NEW RX OF THE MEDICINE   doxycycline (VIBRA-TABS) 100 MG tablet    Sig: Take 1 tablet (100 mg total) by mouth 2 (two) times daily.    Dispense:  30 tablet    Refill:  0   meloxicam (MOBIC) 15 MG tablet    Sig: Take 1 tablet (15 mg total) by mouth daily.    Dispense:  30 tablet    Refill:  1   ALPRAZolam (XANAX) 0.25 MG tablet    Sig: Take 1 tablet (0.25 mg total) by mouth 2 (two) times daily as needed for anxiety.    Dispense:  60 tablet    Refill:  2      Follow-up: Return in about 4 months (around  09/26/2022).  Walker Kehr, MD

## 2022-05-28 NOTE — Assessment & Plan Note (Signed)
On B12 

## 2022-06-05 ENCOUNTER — Ambulatory Visit: Payer: BC Managed Care – PPO

## 2022-06-11 ENCOUNTER — Ambulatory Visit: Payer: BC Managed Care – PPO

## 2022-06-16 ENCOUNTER — Ambulatory Visit: Payer: BC Managed Care – PPO

## 2022-06-18 ENCOUNTER — Other Ambulatory Visit: Payer: Self-pay | Admitting: Internal Medicine

## 2022-06-23 NOTE — Assessment & Plan Note (Signed)
Continue with telmisartan

## 2022-06-25 ENCOUNTER — Other Ambulatory Visit (INDEPENDENT_AMBULATORY_CARE_PROVIDER_SITE_OTHER): Payer: BC Managed Care – PPO

## 2022-06-25 ENCOUNTER — Ambulatory Visit (INDEPENDENT_AMBULATORY_CARE_PROVIDER_SITE_OTHER): Payer: BC Managed Care – PPO

## 2022-06-25 DIAGNOSIS — R7989 Other specified abnormal findings of blood chemistry: Secondary | ICD-10-CM | POA: Diagnosis not present

## 2022-06-25 DIAGNOSIS — R972 Elevated prostate specific antigen [PSA]: Secondary | ICD-10-CM

## 2022-06-25 DIAGNOSIS — E538 Deficiency of other specified B group vitamins: Secondary | ICD-10-CM | POA: Diagnosis not present

## 2022-06-25 DIAGNOSIS — R739 Hyperglycemia, unspecified: Secondary | ICD-10-CM | POA: Diagnosis not present

## 2022-06-25 LAB — BASIC METABOLIC PANEL
BUN: 12 mg/dL (ref 6–23)
CO2: 29 mEq/L (ref 19–32)
Calcium: 9.5 mg/dL (ref 8.4–10.5)
Chloride: 103 mEq/L (ref 96–112)
Creatinine, Ser: 1.06 mg/dL (ref 0.40–1.50)
GFR: 72.07 mL/min (ref >60.00)
Glucose, Bld: 133 mg/dL — ABNORMAL HIGH (ref 70–99)
Potassium: 4.1 mEq/L (ref 3.5–5.1)
Sodium: 137 mEq/L (ref 135–145)

## 2022-06-25 LAB — HEMOGLOBIN A1C: Hgb A1c MFr Bld: 5.6 % (ref 4.6–6.5)

## 2022-06-25 MED ORDER — CYANOCOBALAMIN 1000 MCG/ML IJ SOLN
1000.0000 ug | Freq: Once | INTRAMUSCULAR | Status: AC
  Administered 2022-06-25: 1000 ug via INTRAMUSCULAR

## 2022-06-25 NOTE — Progress Notes (Cosign Needed Addendum)
Patient here for monthly B12 injection per Dr. Alain Marion. B12 1000 mcg given in right  IM and patient tolerated injection well today.   Medical screening examination/treatment/procedure(s) were performed by non-physician practitioner and as supervising physician I was immediately available for consultation/collaboration.  I agree with above. Lew Dawes, MD

## 2022-06-26 LAB — TSH: TSH: 0.49 u[IU]/mL (ref 0.35–5.50)

## 2022-06-26 LAB — T4, FREE: Free T4: 0.93 ng/dL (ref 0.60–1.60)

## 2022-06-26 LAB — PSA, TOTAL AND FREE
PSA, % Free: 27 %
PSA, Free: 1.1 ng/mL
PSA, Total: 4.1 ng/mL — ABNORMAL HIGH

## 2022-07-27 ENCOUNTER — Ambulatory Visit (INDEPENDENT_AMBULATORY_CARE_PROVIDER_SITE_OTHER): Payer: BC Managed Care – PPO

## 2022-07-27 DIAGNOSIS — E538 Deficiency of other specified B group vitamins: Secondary | ICD-10-CM | POA: Diagnosis not present

## 2022-07-27 MED ORDER — CYANOCOBALAMIN 1000 MCG/ML IJ SOLN
1000.0000 ug | Freq: Once | INTRAMUSCULAR | Status: AC
  Administered 2022-07-27: 1000 ug via INTRAMUSCULAR

## 2022-07-27 NOTE — Progress Notes (Addendum)
After obtaining consent, and per orders of Dr. Plotnikov, injection of B12 given by Mackenzey Crownover P Alekxander Isola. Patient instructed to report any adverse reaction to me immediately.   Medical screening examination/treatment/procedure(s) were performed by non-physician practitioner and as supervising physician I was immediately available for consultation/collaboration.  I agree with above. Aleksei Plotnikov, MD  

## 2022-07-30 ENCOUNTER — Other Ambulatory Visit: Payer: Self-pay | Admitting: Internal Medicine

## 2022-08-23 ENCOUNTER — Other Ambulatory Visit: Payer: Self-pay | Admitting: Internal Medicine

## 2022-08-24 ENCOUNTER — Telehealth: Payer: Self-pay | Admitting: Internal Medicine

## 2022-08-24 MED ORDER — TELMISARTAN 80 MG PO TABS: 80.0000 mg | ORAL_TABLET | Freq: Every day | ORAL | 1 refills | Status: DC

## 2022-08-24 NOTE — Telephone Encounter (Signed)
Caller & Relationship to patient: Beckem - self  Call back number: [phone]  Date of last office visit: 05-28-22  Date of next office visit: 09-29-22  Medication(s) to be refilled: telmisartan (MICARDIS) 80 MG tablet   Preferred Pharmacy: [facility], [address] Phone: [phone]  Fax: [phone]       Patient is completely out of medication.

## 2022-08-24 NOTE — Telephone Encounter (Signed)
[facility]

## 2022-08-26 ENCOUNTER — Ambulatory Visit (INDEPENDENT_AMBULATORY_CARE_PROVIDER_SITE_OTHER): Payer: BC Managed Care – PPO | Admitting: *Deleted

## 2022-08-26 DIAGNOSIS — E538 Deficiency of other specified B group vitamins: Secondary | ICD-10-CM | POA: Diagnosis not present

## 2022-08-26 MED ORDER — CYANOCOBALAMIN 1000 MCG/ML IJ SOLN
1000.0000 ug | Freq: Once | INTRAMUSCULAR | Status: AC
  Administered 2022-08-26: 1000 ug via INTRAMUSCULAR

## 2022-08-26 NOTE — Progress Notes (Addendum)
Pls cosign for B12 inj../lmb   Medical screening examination/treatment/procedure(s) were performed by non-physician practitioner and as supervising physician I was immediately available for consultation/collaboration.  I agree with above. Aleksei Plotnikov, MD  

## 2022-08-28 ENCOUNTER — Ambulatory Visit: Payer: BC Managed Care – PPO

## 2022-09-24 ENCOUNTER — Ambulatory Visit (INDEPENDENT_AMBULATORY_CARE_PROVIDER_SITE_OTHER): Payer: BC Managed Care – PPO

## 2022-09-24 DIAGNOSIS — E538 Deficiency of other specified B group vitamins: Secondary | ICD-10-CM | POA: Diagnosis not present

## 2022-09-24 MED ORDER — CYANOCOBALAMIN 1000 MCG/ML IJ SOLN
1000.0000 ug | Freq: Once | INTRAMUSCULAR | Status: AC
  Administered 2022-09-24: 1000 ug via INTRAMUSCULAR

## 2022-09-24 NOTE — Progress Notes (Addendum)
Pt was given B12 injection with no complications.  Medical screening examination/treatment/procedure(s) were performed by non-physician practitioner and as supervising physician I was immediately available for consultation/collaboration.  I agree with above. Lew Dawes, MD

## 2022-09-29 ENCOUNTER — Ambulatory Visit: Payer: BC Managed Care – PPO | Admitting: Internal Medicine

## 2022-10-19 ENCOUNTER — Ambulatory Visit: Payer: BC Managed Care – PPO | Admitting: Internal Medicine

## 2022-10-29 ENCOUNTER — Ambulatory Visit: Payer: BC Managed Care – PPO

## 2022-11-02 ENCOUNTER — Ambulatory Visit: Payer: BC Managed Care – PPO | Admitting: Internal Medicine

## 2022-11-02 ENCOUNTER — Encounter: Payer: Self-pay | Admitting: Internal Medicine

## 2022-11-02 VITALS — BP 130/70 | HR 73 | Temp 98.7°F | Ht 68.0 in | Wt 210.0 lb

## 2022-11-02 DIAGNOSIS — F329 Major depressive disorder, single episode, unspecified: Secondary | ICD-10-CM | POA: Diagnosis not present

## 2022-11-02 DIAGNOSIS — R972 Elevated prostate specific antigen [PSA]: Secondary | ICD-10-CM

## 2022-11-02 DIAGNOSIS — I10 Essential (primary) hypertension: Secondary | ICD-10-CM

## 2022-11-02 DIAGNOSIS — F413 Other mixed anxiety disorders: Secondary | ICD-10-CM | POA: Diagnosis not present

## 2022-11-02 DIAGNOSIS — E538 Deficiency of other specified B group vitamins: Secondary | ICD-10-CM | POA: Diagnosis not present

## 2022-11-02 MED ORDER — CYANOCOBALAMIN 1000 MCG/ML IJ SOLN
1000.0000 ug | Freq: Once | INTRAMUSCULAR | Status: AC
Start: 2022-11-02 — End: 2022-11-02
  Administered 2022-11-02: 1000 ug via INTRAMUSCULAR

## 2022-11-02 NOTE — Assessment & Plan Note (Signed)
Xanax prn - rare  Potential benefits of a long term benzodiazepines  use as well as potential risks  and complications were explained to the patient and were aknowledged. 

## 2022-11-02 NOTE — Assessment & Plan Note (Signed)
Continue with telmisartan 

## 2022-11-02 NOTE — Progress Notes (Signed)
William Acosta how are you  Subjective:  Patient ID: William Acosta, male    DOB: [birth year]  Age: 67 y.o. MRN: [id number]  CC: Follow-up   HPI [name] presents for B12 def, anxiety, depression  Outpatient Medications Prior to Visit  Medication Sig Dispense Refill   ALPRAZolam (XANAX) 0.25 MG tablet Take 1 tablet (0.25 mg total) by mouth 2 (two) times daily as needed for anxiety. 60 tablet 2   meloxicam (MOBIC) 15 MG tablet TAKE 1 TABLET (15 MG TOTAL) BY MOUTH DAILY. 30 tablet 1   telmisartan (MICARDIS) 80 MG tablet Take 1 tablet (80 mg total) by mouth daily. Annual appt due in June must see provider for future refills 90 tablet 1   cyanocobalamin (,VITAMIN B-12,) 1000 MCG/ML injection Inject 1,000 mcg into the muscle every 30 (thirty) days.     doxycycline (VIBRA-TABS) 100 MG tablet Take 1 tablet (100 mg total) by mouth 2 (two) times daily. 30 tablet 0   [facility] SUNSCREEN SPF 30 EX apply topically to face and body daily for 30 (Patient not taking: Reported on 05/28/2022)     levocetirizine (XYZAL) 5 MG tablet Take 5 mg by mouth every evening. (Patient not taking: Reported on 05/28/2022)     triamcinolone cream (KENALOG) 0.1 % Apply 1 application topically 2 (two) times daily. (Patient not taking: Reported on 05/28/2022) 30 g 0   Facility-Administered Medications Prior to Visit  Medication Dose Route Frequency Provider Last Rate Last Admin   cyanocobalamin (VITAMIN B12) injection 1,000 mcg  1,000 mcg Intramuscular Q30 days Auriel Kist, Georgina Quint, MD   1,000 mcg at 03/27/22 1452    ROS: Review of Systems  Constitutional:  Negative for appetite change, fatigue and unexpected weight change.  HENT:  Negative for congestion, nosebleeds, sneezing, sore throat and trouble swallowing.   Eyes:  Negative for itching and visual disturbance.  Respiratory:  Negative for cough.   Cardiovascular:  Negative for chest pain, palpitations and leg swelling.  Gastrointestinal:  Negative for abdominal distention,  blood in stool, diarrhea and nausea.  Genitourinary:  Negative for frequency and hematuria.  Musculoskeletal:  Negative for back pain, gait problem, joint swelling and neck pain.  Skin:  Negative for rash.  Neurological:  Negative for dizziness, tremors, speech difficulty and weakness.  Psychiatric/Behavioral:  Negative for agitation, dysphoric mood and sleep disturbance. The patient is not nervous/anxious.     Objective:  BP 130/70 (BP Location: Left Arm, Patient Position: Sitting, Cuff Size: Large)   Pulse 73   Temp 98.7 F (37.1 C) (Oral)   Ht 5\' 8"  (1.727 m)   Wt 210 lb (95.3 kg)   SpO2 98%   BMI 31.93 kg/m   BP Readings from Last 3 Encounters:  11/02/22 130/70  05/28/22 138/78  01/26/22 130/74    Wt Readings from Last 3 Encounters:  11/02/22 210 lb (95.3 kg)  05/28/22 219 lb (99.3 kg)  01/26/22 212 lb (96.2 kg)    Physical Exam Constitutional:      General: He is not in acute distress.    Appearance: He is well-developed. He is obese.     Comments: NAD  Eyes:     Conjunctiva/sclera: Conjunctivae normal.     Pupils: Pupils are equal, round, and reactive to light.  Neck:     Thyroid: No thyromegaly.     Vascular: No JVD.  Cardiovascular:     Rate and Rhythm: Normal rate and regular rhythm.     Heart sounds: Normal heart sounds.  No murmur heard.    No friction rub. No gallop.  Pulmonary:     Effort: Pulmonary effort is normal. No respiratory distress.     Breath sounds: Normal breath sounds. No wheezing or rales.  Chest:     Chest wall: No tenderness.  Abdominal:     General: Bowel sounds are normal. There is no distension.     Palpations: Abdomen is soft. There is no mass.     Tenderness: There is no abdominal tenderness. There is no guarding or rebound.  Musculoskeletal:        General: No tenderness. Normal range of motion.     Cervical back: Normal range of motion.  Lymphadenopathy:     Cervical: No cervical adenopathy.  Skin:    General: Skin is  warm and dry.     Findings: No rash.  Neurological:     Mental Status: He is alert and oriented to person, place, and time.     Cranial Nerves: No cranial nerve deficit.     Motor: No abnormal muscle tone.     Coordination: Coordination normal.     Gait: Gait normal.     Deep Tendon Reflexes: Reflexes are normal and symmetric.  Psychiatric:        Behavior: Behavior normal.        Thought Content: Thought content normal.        Judgment: Judgment normal.     Lab Results  Component Value Date   WBC 9.3 11/13/2021   HGB 15.6 11/13/2021   HCT 45.7 11/13/2021   PLT 277.0 11/13/2021   GLUCOSE 133 (H) 06/25/2022   CHOL 167 11/13/2021   TRIG 98.0 11/13/2021   HDL 35.00 (L) 11/13/2021   LDLCALC 112 (H) 11/13/2021   ALT 13 11/13/2021   AST 16 11/13/2021   NA 137 06/25/2022   K 4.1 06/25/2022   CL 103 06/25/2022   CREATININE 1.06 06/25/2022   BUN 12 06/25/2022   CO2 29 06/25/2022   TSH 0.49 06/25/2022   PSA 4.25 (H) 11/13/2021   HGBA1C 5.6 06/25/2022    No results found.  Assessment & Plan:   Problem List Items Addressed This Visit     Anxiety disorder    Xanax prn - rare  Potential benefits of a long term benzodiazepines  use as well as potential risks  and complications were explained to the patient and were aknowledged.      Depression    Currently off antidepressants      Hypertension    Continue with telmisartan      B12 deficiency - Primary    On B12      Elevated PSA    Monitor PSA         Meds ordered this encounter  Medications   cyanocobalamin (VITAMIN B12) injection 1,000 mcg      Follow-up: Return in about 3 months (around 02/02/2023) for a follow-up visit.  Sonda Primes, MD

## 2022-11-02 NOTE — Assessment & Plan Note (Signed)
Monitor PSA 

## 2022-11-02 NOTE — Assessment & Plan Note (Signed)
Currently off antidepressants

## 2022-11-02 NOTE — Assessment & Plan Note (Signed)
On B12 

## 2022-11-02 NOTE — Patient Instructions (Signed)
USEFUL THINGS FOR HAND ARTHRITIS  RICE SOCK HEATING PAD: [email or web address]://[email or web address].com/Homemade-Heating-Pad/         BLUE EMU CREAM:

## 2022-12-28 ENCOUNTER — Ambulatory Visit (INDEPENDENT_AMBULATORY_CARE_PROVIDER_SITE_OTHER): Payer: BC Managed Care – PPO

## 2022-12-28 DIAGNOSIS — E538 Deficiency of other specified B group vitamins: Secondary | ICD-10-CM

## 2022-12-28 MED ORDER — CYANOCOBALAMIN 1000 MCG/ML IJ SOLN
1000.0000 ug | Freq: Once | INTRAMUSCULAR | Status: AC
Start: 2022-12-28 — End: 2022-12-28
  Administered 2022-12-28: 1000 ug via INTRAMUSCULAR

## 2022-12-28 NOTE — Progress Notes (Addendum)
Pt here for monthly B12 injection per   B12 given IM and pt tolerated injection well.  Medical screening examination/treatment/procedure(s) were performed by non-physician practitioner and as supervising physician I was immediately available for consultation/collaboration.  I agree with above. Jacinta Shoe, MD

## 2023-01-28 ENCOUNTER — Ambulatory Visit (INDEPENDENT_AMBULATORY_CARE_PROVIDER_SITE_OTHER): Payer: BC Managed Care – PPO | Admitting: *Deleted

## 2023-01-28 DIAGNOSIS — E538 Deficiency of other specified B group vitamins: Secondary | ICD-10-CM | POA: Diagnosis not present

## 2023-01-28 MED ORDER — CYANOCOBALAMIN 1000 MCG/ML IJ SOLN
1000.0000 ug | Freq: Once | INTRAMUSCULAR | Status: AC
Start: 2023-01-28 — End: 2023-01-28
  Administered 2023-01-28: 1000 ug via INTRAMUSCULAR

## 2023-01-28 NOTE — Progress Notes (Addendum)
Pls cosign for B12 inj../lmb   Medical screening examination/treatment/procedure(s) were performed by non-physician practitioner and as supervising physician I was immediately available for consultation/collaboration.  I agree with above. Aleksei Plotnikov, MD  

## 2023-02-03 ENCOUNTER — Ambulatory Visit: Payer: BC Managed Care – PPO | Admitting: Internal Medicine

## 2023-02-11 ENCOUNTER — Encounter (INDEPENDENT_AMBULATORY_CARE_PROVIDER_SITE_OTHER): Payer: Self-pay

## 2023-03-04 ENCOUNTER — Ambulatory Visit (INDEPENDENT_AMBULATORY_CARE_PROVIDER_SITE_OTHER): Payer: BC Managed Care – PPO

## 2023-03-04 ENCOUNTER — Ambulatory Visit: Payer: BC Managed Care – PPO

## 2023-03-04 DIAGNOSIS — E538 Deficiency of other specified B group vitamins: Secondary | ICD-10-CM

## 2023-03-04 MED ORDER — CYANOCOBALAMIN 1000 MCG/ML IJ SOLN
1000.0000 ug | Freq: Once | INTRAMUSCULAR | Status: AC
Start: 2023-03-04 — End: 2023-03-04
  Administered 2023-03-04: 1000 ug via INTRAMUSCULAR

## 2023-03-04 NOTE — Progress Notes (Signed)
Pt was given B12 injection with no complications.

## 2023-03-09 ENCOUNTER — Ambulatory Visit: Payer: BC Managed Care – PPO | Admitting: Internal Medicine

## 2023-04-05 ENCOUNTER — Ambulatory Visit: Payer: BC Managed Care – PPO

## 2023-04-07 ENCOUNTER — Ambulatory Visit: Payer: BC Managed Care – PPO

## 2023-04-15 ENCOUNTER — Ambulatory Visit: Payer: BC Managed Care – PPO

## 2023-04-15 DIAGNOSIS — E538 Deficiency of other specified B group vitamins: Secondary | ICD-10-CM

## 2023-04-15 MED ORDER — CYANOCOBALAMIN 1000 MCG/ML IJ SOLN
1000.0000 ug | Freq: Once | INTRAMUSCULAR | Status: AC
Start: 2023-04-15 — End: 2023-04-15
  Administered 2023-04-15: 1000 ug via INTRAMUSCULAR

## 2023-04-15 NOTE — Progress Notes (Cosign Needed Addendum)
After obtaining consent, and per orders of Dr. Plotnikov, injection of B12 given by Rabab Currington P Cache Decoursey. Patient instructed to report any adverse reaction to me immediately.  Medical screening examination/treatment/procedure(s) were performed by non-physician practitioner and as supervising physician I was immediately available for consultation/collaboration.  I agree with above. Aleksei Plotnikov, MD  

## 2023-05-03 ENCOUNTER — Ambulatory Visit: Payer: BC Managed Care – PPO | Admitting: Podiatry

## 2023-05-20 ENCOUNTER — Ambulatory Visit: Payer: BC Managed Care – PPO

## 2023-05-20 ENCOUNTER — Ambulatory Visit: Payer: BC Managed Care – PPO | Admitting: Podiatry

## 2023-05-26 ENCOUNTER — Ambulatory Visit: Payer: BC Managed Care – PPO

## 2023-05-26 DIAGNOSIS — E538 Deficiency of other specified B group vitamins: Secondary | ICD-10-CM

## 2023-05-26 MED ORDER — CYANOCOBALAMIN 1000 MCG/ML IJ SOLN
1000.0000 ug | Freq: Once | INTRAMUSCULAR | Status: AC
Start: 2023-05-26 — End: 2023-05-26
  Administered 2023-05-26: 1000 ug via INTRAMUSCULAR

## 2023-05-26 NOTE — Progress Notes (Addendum)
After obtaining consent, and per orders of Dr. Posey Rea, injection of B12  given by Ferdie Ping. Patient instructed to report any adverse reaction to me immediately.

## 2023-06-25 ENCOUNTER — Ambulatory Visit (INDEPENDENT_AMBULATORY_CARE_PROVIDER_SITE_OTHER): Payer: BC Managed Care – PPO

## 2023-06-25 DIAGNOSIS — E538 Deficiency of other specified B group vitamins: Secondary | ICD-10-CM

## 2023-06-25 MED ORDER — CYANOCOBALAMIN 1000 MCG/ML IJ SOLN
1000.0000 ug | Freq: Once | INTRAMUSCULAR | Status: AC
Start: 2023-06-25 — End: 2023-06-25
  Administered 2023-06-25: 1000 ug via INTRAMUSCULAR

## 2023-06-25 NOTE — Progress Notes (Cosign Needed)
Pt here for monthly B12 injection per   B12 given IM and pt tolerated injection well.  Patient responded well to injection given in the right arm

## 2023-07-11 ENCOUNTER — Encounter (HOSPITAL_COMMUNITY): Payer: Self-pay | Admitting: Emergency Medicine

## 2023-07-11 ENCOUNTER — Emergency Department (HOSPITAL_COMMUNITY)
Admission: EM | Admit: 2023-07-11 | Discharge: 2023-07-11 | Disposition: A | Discharge status: Home / Self Care | Payer: 59 | Attending: Emergency Medicine | Admitting: Emergency Medicine

## 2023-07-11 DIAGNOSIS — K047 Periapical abscess without sinus: Secondary | ICD-10-CM | POA: Insufficient documentation

## 2023-07-11 DIAGNOSIS — R220 Localized swelling, mass and lump, head: Secondary | ICD-10-CM | POA: Diagnosis present

## 2023-07-11 MED ORDER — CHLORHEXIDINE GLUCONATE 0.12 % MT SOLN: 15.0000 mL | Freq: Two times a day (BID) | OROMUCOSAL | 0 refills | Status: AC

## 2023-07-11 MED ORDER — AMOXICILLIN 500 MG PO CAPS: 500.0000 mg | ORAL_CAPSULE | Freq: Three times a day (TID) | ORAL | 0 refills | Status: DC

## 2023-07-11 MED ORDER — AMOXICILLIN 500 MG PO CAPS
500.0000 mg | ORAL_CAPSULE | Freq: Once | ORAL | Status: AC
  Administered 2023-07-11: 500 mg via ORAL
  Filled 2023-07-11: qty 1

## 2023-07-11 NOTE — ED Triage Notes (Addendum)
 Pt woke up with facial swelling to left side but no pain. Went to dentist about 4 weeks ago. Will need extractions of those teeth but no surgery performed. He took tylenol at home earlier for headache.  Fluid feels loose and very palpable. He does say he slept on that side of his face last night.

## 2023-07-11 NOTE — ED Provider Notes (Signed)
 [facility]EMERGENCY DEPT [facility] Emergency Department Provider Note MRN:  [id number]  Arrival date & time: 07/11/23     Chief Complaint   Facial Swelling   History of Present Illness   William Acosta is a 68 y.o. year-old male presents to the ED with chief complaint of facial swelling.  Patient states that he has 2 teeth that need to be pulled.  He states that he was seen by his dentist a while back and was started on some antibiotic.  He is concerned about recurrent infection.  He reports mild pain.  States that the swelling just developed today.  He states that he has had some foul taste in his mouth for the past day or so.  Denies any fevers or chills.  Denies any other treatments prior to arrival..  History provided by patient.   Review of Systems  Pertinent positive and negative review of systems noted in HPI.    Physical Exam   Vitals:   07/11/23 0518  BP: (!) 152/81  Pulse: (!) 110  Resp: 19  Temp: 99.1 F (37.3 C)  SpO2: 100%    CONSTITUTIONAL:  well-appearing, NAD NEURO:  Alert and oriented x 3, CN 3-12 grossly intact EYES:  eyes equal and reactive ENT/NECK:  Supple, no stridor, poor dentition, no visible abscess or fluid collection, there is some mild swelling to the left lower jaw, no evidence to suggest ludwig's, no sublingual tenderness CARDIO:  tachycardic, appears well-perfused  PULM:  No respiratory distress,  GI/GU:  non-distended,  MSK/SPINE:  No gross deformities, no edema, moves all extremities  SKIN:  no rash, atraumatic   *Additional and/or pertinent findings included in MDM below  Diagnostic and Interventional Summary    EKG Interpretation Date/Time:    Ventricular Rate:    PR Interval:    QRS Duration:    QT Interval:    QTC Calculation:   R Axis:      Text Interpretation:         Labs Reviewed - No data to display  No orders to display    Medications  amoxicillin  (AMOXIL ) capsule 500 mg (has no administration in time range)      Procedures  /  Critical Care Procedures  ED Course and Medical Decision Making  I have reviewed the triage vital signs, the nursing notes, and pertinent available records from the EMR.  Social Determinants Affecting Complexity of Care: Patient has no clinically significant social determinants affecting this chief complaint..   ED Course:    Medical Decision Making Risk Prescription drug management.    Patient with dentalgia.  No abscess requiring immediate incision and drainage.  Exam not concerning for Ludwig's angina or pharyngeal abscess.  Will treat with amox. Pt instructed to follow-up with dentist.  Discussed return precautions. Pt safe for discharge.      Consultants: No consultations were needed in caring for this patient.   Treatment and Plan: Emergency department workup does not suggest an emergent condition requiring admission or immediate intervention beyond  what has been performed at this time. The patient is safe for discharge and has  been instructed to return immediately for worsening symptoms, change in  symptoms or any other concerns    Final Clinical Impressions(s) / ED Diagnoses     ICD-10-CM   1. Dental abscess  K04.7       ED Discharge Orders          Ordered    amoxicillin  (AMOXIL ) 500 MG capsule  3 times daily        07/11/23 0535    chlorhexidine  (PERIDEX ) 0.12 % solution  2 times daily        07/11/23 0535              Discharge Instructions Discussed with and Provided to Patient:   Discharge Instructions   None      Vicky Charleston, PA-C 07/11/23 0535    Haze Lonni PARAS, MD 07/11/23 [phone]

## 2023-07-26 ENCOUNTER — Ambulatory Visit: Payer: 59

## 2023-07-26 DIAGNOSIS — E538 Deficiency of other specified B group vitamins: Secondary | ICD-10-CM

## 2023-07-26 MED ORDER — CYANOCOBALAMIN 1000 MCG/ML IJ SOLN
1000.0000 ug | Freq: Once | INTRAMUSCULAR | Status: AC
Start: 2023-07-26 — End: 2023-07-26
  Administered 2023-07-26: 1000 ug via INTRAMUSCULAR

## 2023-07-26 NOTE — Progress Notes (Addendum)
Pt here for monthly B12 injection per Dr. Posey Rea   B12 given IM and pt tolerated injection well.  Next B12 injection scheduled for 08/26/23  Patient advised to report to the office immediately if he notices any adverse reactions.    Medical screening examination/treatment/procedure(s) were performed by non-physician practitioner and as supervising physician I was immediately available for consultation/collaboration.  I agree with above. Jacinta Shoe, MD

## 2023-08-26 ENCOUNTER — Ambulatory Visit (INDEPENDENT_AMBULATORY_CARE_PROVIDER_SITE_OTHER): Payer: 59 | Admitting: Internal Medicine

## 2023-08-26 ENCOUNTER — Encounter: Payer: Self-pay | Admitting: Internal Medicine

## 2023-08-26 ENCOUNTER — Ambulatory Visit: Payer: 59

## 2023-08-26 VITALS — BP 120/70 | HR 64 | Temp 98.5°F | Ht 69.0 in | Wt 205.2 lb

## 2023-08-26 DIAGNOSIS — R197 Diarrhea, unspecified: Secondary | ICD-10-CM | POA: Diagnosis not present

## 2023-08-26 DIAGNOSIS — B349 Viral infection, unspecified: Secondary | ICD-10-CM | POA: Diagnosis not present

## 2023-08-26 DIAGNOSIS — R5383 Other fatigue: Secondary | ICD-10-CM

## 2023-08-26 DIAGNOSIS — E538 Deficiency of other specified B group vitamins: Secondary | ICD-10-CM | POA: Diagnosis not present

## 2023-08-26 DIAGNOSIS — R7989 Other specified abnormal findings of blood chemistry: Secondary | ICD-10-CM | POA: Diagnosis not present

## 2023-08-26 MED ORDER — CYANOCOBALAMIN 1000 MCG/ML IJ SOLN
1000.0000 ug | Freq: Once | INTRAMUSCULAR | Status: AC
  Administered 2023-08-26: 1000 ug via INTRAMUSCULAR

## 2023-08-26 NOTE — Progress Notes (Signed)
 Subjective:  Patient ID: William Acosta, male    DOB: 08-29-1955  Age: 68 y.o. MRN: [id number]  CC: Arm Pain (Left elbow pain, ongoing for about a week. Had tennis elbow in the past)   HPI William Acosta presents for being sick x 2 wks - ST, cough, some diarrhea. Sahith is still tired; feeling better, however.  Outpatient Medications Prior to Visit  Medication Sig Dispense Refill   ALPRAZolam (XANAX) 0.25 MG tablet Take 1 tablet (0.25 mg total) by mouth 2 (two) times daily as needed for anxiety. 60 tablet 2   chlorhexidine (PERIDEX) 0.12 % solution Use as directed 15 mLs in the mouth or throat 2 (two) times daily. 120 mL 0   meloxicam (MOBIC) 15 MG tablet TAKE 1 TABLET (15 MG TOTAL) BY MOUTH DAILY. 30 tablet 1   telmisartan (MICARDIS) 80 MG tablet Take 1 tablet (80 mg total) by mouth daily. Annual appt due in June must see provider for future refills 90 tablet 1   amoxicillin (AMOXIL) 500 MG capsule Take 1 capsule (500 mg total) by mouth 3 (three) times daily. (Patient not taking: Reported on 08/26/2023) 30 capsule 0   Facility-Administered Medications Prior to Visit  Medication Dose Route Frequency Provider Last Rate Last Admin   cyanocobalamin (VITAMIN B12) injection 1,000 mcg  1,000 mcg Intramuscular Q30 days Anikah Hogge, Georgina Quint, MD   1,000 mcg at 03/27/22 1452    ROS: Review of Systems  Constitutional:  Positive for fatigue. Negative for appetite change, diaphoresis and unexpected weight change.  HENT:  Positive for congestion. Negative for nosebleeds, sneezing, sore throat and trouble swallowing.   Eyes:  Negative for itching and visual disturbance.  Respiratory:  Negative for cough and wheezing.   Cardiovascular:  Negative for chest pain, palpitations and leg swelling.  Gastrointestinal:  Negative for abdominal distention, blood in stool, diarrhea and nausea.  Genitourinary:  Negative for frequency and hematuria.  Musculoskeletal:  Negative for back pain, gait problem, joint  swelling and neck pain.  Skin:  Negative for rash.  Neurological:  Negative for dizziness, tremors, speech difficulty and weakness.  Psychiatric/Behavioral:  Negative for agitation, dysphoric mood and sleep disturbance. The patient is not nervous/anxious.     Objective:  BP 120/70 (BP Location: Left Arm, Patient Position: Sitting)   Pulse 64   Temp 98.5 F (36.9 C) (Temporal)   Ht 5\' 9"  (1.753 m)   Wt 205 lb 3.2 oz (93.1 kg)   SpO2 98%   BMI 30.30 kg/m   BP Readings from Last 3 Encounters:  08/26/23 120/70  07/11/23 (!) 133/90  11/02/22 130/70    Wt Readings from Last 3 Encounters:  08/26/23 205 lb 3.2 oz (93.1 kg)  07/11/23 210 lb (95.3 kg)  11/02/22 210 lb (95.3 kg)    Physical Exam Constitutional:      General: He is not in acute distress.    Appearance: He is well-developed.     Comments: NAD  Eyes:     Conjunctiva/sclera: Conjunctivae normal.     Pupils: Pupils are equal, round, and reactive to light.  Neck:     Thyroid: No thyromegaly.     Vascular: No JVD.  Cardiovascular:     Rate and Rhythm: Normal rate and regular rhythm.     Heart sounds: Normal heart sounds. No murmur heard.    No friction rub. No gallop.  Pulmonary:     Effort: Pulmonary effort is normal. No respiratory distress.     Breath  sounds: Normal breath sounds. No wheezing or rales.  Chest:     Chest wall: No tenderness.  Abdominal:     General: Bowel sounds are normal. There is no distension.     Palpations: Abdomen is soft. There is no mass.     Tenderness: There is no abdominal tenderness. There is no guarding or rebound.  Musculoskeletal:        General: No tenderness. Normal range of motion.     Cervical back: Normal range of motion.  Lymphadenopathy:     Cervical: No cervical adenopathy.  Skin:    General: Skin is warm and dry.     Findings: No rash.  Neurological:     Mental Status: He is alert and oriented to person, place, and time.     Cranial Nerves: No cranial nerve  deficit.     Motor: No abnormal muscle tone.     Coordination: Coordination normal.     Gait: Gait normal.     Deep Tendon Reflexes: Reflexes are normal and symmetric.  Psychiatric:        Behavior: Behavior normal.        Thought Content: Thought content normal.        Judgment: Judgment normal.     Lab Results  Component Value Date   WBC 9.3 11/13/2021   HGB 15.6 11/13/2021   HCT 45.7 11/13/2021   PLT 277.0 11/13/2021   GLUCOSE 133 (H) 06/25/2022   CHOL 167 11/13/2021   TRIG 98.0 11/13/2021   HDL 35.00 (L) 11/13/2021   LDLCALC 112 (H) 11/13/2021   ALT 13 11/13/2021   AST 16 11/13/2021   NA 137 06/25/2022   K 4.1 06/25/2022   CL 103 06/25/2022   CREATININE 1.06 06/25/2022   BUN 12 06/25/2022   CO2 29 06/25/2022   TSH 0.49 06/25/2022   PSA 4.25 (H) 11/13/2021   HGBA1C 5.6 06/25/2022    No results found.  Assessment & Plan:   Problem List Items Addressed This Visit     Fatigue   Due to a viral illness. [facility], CXR      Relevant Orders   CBC with Differential/Platelet   Comprehensive metabolic panel   Testosterone   TSH   T4, free   Urinalysis   Vitamin B12   VITAMIN D 25 Hydroxy (Vit-D Deficiency, Fractures)   Cortisol   DG Chest 2 View   B12 deficiency - Primary   On Vit       Relevant Orders   Vitamin B12   Low testosterone   Relevant Orders   CBC with Differential/Platelet   Testosterone   Cortisol   Diarrhea   Due to a viral illness. Resolved      Viral illness   Recovering      Relevant Orders   CBC with Differential/Platelet   Comprehensive metabolic panel   Testosterone   TSH   T4, free   Urinalysis   VITAMIN D 25 Hydroxy (Vit-D Deficiency, Fractures)   Cortisol   DG Chest 2 View      Meds ordered this encounter  Medications   cyanocobalamin (VITAMIN B12) injection 1,000 mcg      Follow-up: Return in about 4 months (around 12/24/2023) for a follow-up visit.  Sonda Primes, MD

## 2023-08-26 NOTE — Assessment & Plan Note (Signed)
Recovering  

## 2023-08-26 NOTE — Assessment & Plan Note (Signed)
 Due to a viral illness. Resolved

## 2023-08-26 NOTE — Patient Instructions (Signed)
 [email or web address]

## 2023-08-26 NOTE — Assessment & Plan Note (Signed)
 Due to a viral illness. [facility], CXR

## 2023-08-26 NOTE — Assessment & Plan Note (Signed)
 On Vit

## 2023-10-29 ENCOUNTER — Other Ambulatory Visit (INDEPENDENT_AMBULATORY_CARE_PROVIDER_SITE_OTHER)

## 2023-10-29 DIAGNOSIS — E538 Deficiency of other specified B group vitamins: Secondary | ICD-10-CM | POA: Diagnosis not present

## 2023-10-29 DIAGNOSIS — R7989 Other specified abnormal findings of blood chemistry: Secondary | ICD-10-CM

## 2023-10-29 DIAGNOSIS — R5383 Other fatigue: Secondary | ICD-10-CM | POA: Diagnosis not present

## 2023-10-29 DIAGNOSIS — B349 Viral infection, unspecified: Secondary | ICD-10-CM

## 2023-10-29 LAB — CBC WITH DIFFERENTIAL/PLATELET
Basophils Absolute: 0.1 10*3/uL (ref 0.0–0.1)
Basophils Relative: 1.3 % (ref 0.0–3.0)
Eosinophils Absolute: 0.2 10*3/uL (ref 0.0–0.7)
Eosinophils Relative: 2.7 % (ref 0.0–5.0)
HCT: 48.1 % (ref 39.0–52.0)
Hemoglobin: 16.1 g/dL (ref 13.0–17.0)
Lymphocytes Relative: 34 % (ref 12.0–46.0)
Lymphs Abs: 2 10*3/uL (ref 0.7–4.0)
MCHC: 33.4 g/dL (ref 30.0–36.0)
MCV: 92.7 fl (ref 78.0–100.0)
Monocytes Absolute: 0.5 10*3/uL (ref 0.1–1.0)
Monocytes Relative: 8.2 % (ref 3.0–12.0)
Neutro Abs: 3.2 10*3/uL (ref 1.4–7.7)
Neutrophils Relative %: 53.8 % (ref 43.0–77.0)
Platelets: 246 10*3/uL (ref 150.0–400.0)
RBC: 5.19 Mil/uL (ref 4.22–5.81)
RDW: 13.6 % (ref 11.5–15.5)
WBC: 6 10*3/uL (ref 4.0–10.5)

## 2023-10-29 LAB — URINALYSIS
Bilirubin Urine: NEGATIVE
Hgb urine dipstick: NEGATIVE
Ketones, ur: NEGATIVE
Leukocytes,Ua: NEGATIVE
Nitrite: NEGATIVE
Specific Gravity, Urine: 1.015 (ref 1.000–1.030)
Total Protein, Urine: NEGATIVE
Urine Glucose: NEGATIVE
Urobilinogen, UA: 0.2 (ref 0.0–1.0)
pH: 7 (ref 5.0–8.0)

## 2023-10-29 LAB — COMPREHENSIVE METABOLIC PANEL WITH GFR
ALT: 13 U/L (ref 0–53)
AST: 16 U/L (ref 0–37)
Albumin: 4 g/dL (ref 3.5–5.2)
Alkaline Phosphatase: 107 U/L (ref 39–117)
BUN: 9 mg/dL (ref 6–23)
CO2: 30 meq/L (ref 19–32)
Calcium: 9.2 mg/dL (ref 8.4–10.5)
Chloride: 105 meq/L (ref 96–112)
Creatinine, Ser: 0.99 mg/dL (ref 0.40–1.50)
GFR: 77.49 mL/min (ref >60.00)
Glucose, Bld: 91 mg/dL (ref 70–99)
Potassium: 4.6 meq/L (ref 3.5–5.1)
Sodium: 142 meq/L (ref 135–145)
Total Bilirubin: 0.7 mg/dL (ref 0.2–1.2)
Total Protein: 6.4 g/dL (ref 6.0–8.3)

## 2023-10-29 LAB — VITAMIN D 25 HYDROXY (VIT D DEFICIENCY, FRACTURES): VITD: 16.19 ng/mL — ABNORMAL LOW (ref 30.00–100.00)

## 2023-10-29 LAB — CORTISOL: Cortisol, Plasma: 11.4 ug/dL

## 2023-10-29 LAB — T4, FREE: Free T4: 0.83 ng/dL (ref 0.60–1.60)

## 2023-10-29 LAB — VITAMIN B12: Vitamin B-12: 219 pg/mL (ref 211–911)

## 2023-10-29 LAB — TSH: TSH: 0.92 u[IU]/mL (ref 0.35–5.50)

## 2023-10-29 LAB — TESTOSTERONE: Testosterone: 564.06 ng/dL (ref 300.00–890.00)

## 2023-11-04 ENCOUNTER — Telehealth: Payer: Self-pay | Admitting: Internal Medicine

## 2023-11-04 NOTE — Telephone Encounter (Signed)
 Copied from CRM [id number]. Topic: Clinical - Lab/Test Results >> Nov 04, 2023  2:51 PM Clyde Darling P wrote: Reason for CRM: Pt called back relayed lab results- pt advise he receive his B12 injection every 28 days and he just started taking vitamin D3 last week over the counter, he would like to know how many milligram need to be taken or what type of vitamin d  he should take- pt would prefer to be contact at [phone]

## 2023-11-11 ENCOUNTER — Other Ambulatory Visit: Payer: Self-pay | Admitting: Internal Medicine

## 2023-11-11 NOTE — Telephone Encounter (Signed)
 Reason for CRM: Pt called back relayed lab results- pt advise he receive his B12 injection every 28 days and he just started taking vitamin D3 last week over the counter, he would like to know how many milligram need to be taken or what type of vitamin d  he should take-

## 2023-11-12 ENCOUNTER — Ambulatory Visit: Payer: Self-pay

## 2023-11-12 MED ORDER — VITAMIN D (ERGOCALCIFEROL) 1.25 MG (50000 UNIT) PO CAPS
50000.0000 [IU] | ORAL_CAPSULE | ORAL | 0 refills | Status: DC
Start: 2023-11-12 — End: 2024-04-18

## 2023-11-12 MED ORDER — VITAMIN D3 50 MCG (2000 UT) PO CAPS: 2000.0000 [IU] | ORAL_CAPSULE | Freq: Every day | ORAL | 3 refills | Status: DC

## 2023-11-12 NOTE — Telephone Encounter (Signed)
 Start Vit D prescription 50000 iu weekly (Rx emailed to his pharmacy) followed by over-the-counter Vit D 2000 iu daily. Thx

## 2023-11-12 NOTE — Telephone Encounter (Signed)
 This RN spoke with pt and let pt know:   "Per message from Dr. Arlester Ladd he is to start the over the counter vit D after finishing the weekly prescribed one."     Pt wants to know why he is low in vitamin D .

## 2023-11-12 NOTE — Telephone Encounter (Signed)
 Please inform pt upon his return call to the clinic per his PCP "Start Vit D prescription 50000 iu weekly (Rx emailed to his pharmacy) followed by over-the-counter Vit D 2000 iu daily. Thx"

## 2023-11-12 NOTE — Telephone Encounter (Addendum)
 Attempted to contact pt, left message to rtc.   Per message from Dr. Arlester Ladd he is to start the over the counter vit D after finishing the weekly prescribed one.    Copied from CRM [id number]. Topic: Clinical - Medication Question >> Nov 12, 2023  4:05 PM William Acosta wrote: Reason for CRM: Patient called in needing clarification on 2 medications. Patient is asking if he needs to finish the weekly medication first (Vitamin D , Ergocalciferol , (DRISDOL ) 1.25 MG (50000 UNIT) CAPS capsule) before he starts taking the daily medication (Cholecalciferol (VITAMIN D3) 50 MCG (2000 UT) capsule) or if he should be taking these at the same time.

## 2023-11-12 NOTE — Addendum Note (Signed)
 Addended by: Shilo Philipson V on: 11/12/2023 07:24 AM   Modules accepted: Orders

## 2023-11-15 NOTE — Telephone Encounter (Signed)
 Copied from CRM [id number]. Topic: Clinical - Medication Question >> Nov 12, 2023  4:05 PM [place] E wrote: Reason for CRM: Patient called in needing clarification on 2 medications. Patient is asking if he needs to finish the weekly medication first (Vitamin D , Ergocalciferol , (DRISDOL ) 1.25 MG (50000 UNIT) CAPS capsule) before he starts taking the daily medication (Cholecalciferol (VITAMIN D3) 50 MCG (2000 UT) capsule) or if he should be taking these at the same time. >> Nov 12, 2023  5:14 PM Luane Rumps D wrote: Patient returned call after office hours, advised he could expect follow up on Monday.

## 2023-11-16 NOTE — Telephone Encounter (Signed)
 William Acosta needs to finish his vitamin D  weekly prescription.  Then he can start daily over-the-counter vitamin D . Vitamin D  deficiency is a common condition -not enough vitamin D  in our diet. Thanks

## 2023-11-17 ENCOUNTER — Ambulatory Visit: Payer: Self-pay

## 2023-11-26 ENCOUNTER — Ambulatory Visit

## 2023-11-26 DIAGNOSIS — E538 Deficiency of other specified B group vitamins: Secondary | ICD-10-CM

## 2023-11-26 MED ORDER — CYANOCOBALAMIN 1000 MCG/ML IJ SOLN
1000.0000 ug | Freq: Once | INTRAMUSCULAR | Status: AC
  Administered 2023-11-26: 1000 ug via INTRAMUSCULAR

## 2023-11-26 NOTE — Progress Notes (Addendum)
 Pt here for monthly B12 injection per Dr. Arlester Ladd.   B12 1000mcg given IM and pt tolerated injection well.  Patient has scheduled his next B12 Injection.   Medical screening examination/treatment/procedure(s) were performed by non-physician practitioner and as supervising physician I was immediately available for consultation/collaboration.  I agree with above. Adelaide Holy, MD

## 2023-11-29 ENCOUNTER — Ambulatory Visit: Admitting: Family Medicine

## 2023-11-29 ENCOUNTER — Ambulatory Visit (INDEPENDENT_AMBULATORY_CARE_PROVIDER_SITE_OTHER): Admitting: Internal Medicine

## 2023-11-29 ENCOUNTER — Encounter: Payer: Self-pay | Admitting: Internal Medicine

## 2023-11-29 VITALS — BP 136/82 | HR 79 | Temp 98.4°F | Ht 69.0 in | Wt 206.0 lb

## 2023-11-29 DIAGNOSIS — T63301A Toxic effect of unspecified spider venom, accidental (unintentional), initial encounter: Secondary | ICD-10-CM | POA: Diagnosis not present

## 2023-11-29 DIAGNOSIS — E559 Vitamin D deficiency, unspecified: Secondary | ICD-10-CM | POA: Diagnosis not present

## 2023-11-29 DIAGNOSIS — E538 Deficiency of other specified B group vitamins: Secondary | ICD-10-CM | POA: Diagnosis not present

## 2023-11-29 MED ORDER — DOXYCYCLINE HYCLATE 100 MG PO TABS: 100.0000 mg | ORAL_TABLET | Freq: Two times a day (BID) | ORAL | 0 refills | Status: DC

## 2023-11-29 MED ORDER — TRIAMCINOLONE ACETONIDE 0.1 % EX CREA: 1.0000 | TOPICAL_CREAM | Freq: Two times a day (BID) | CUTANEOUS | 1 refills | Status: AC

## 2023-11-29 NOTE — Assessment & Plan Note (Signed)
 Lab Results  Component Value Date   VITAMINB12 219 10/29/2023   Low, to start oral replacement - b12 1000 mcg qd

## 2023-11-29 NOTE — Assessment & Plan Note (Signed)
 With possible early infection, for doxycycline  100 bid course, and triam cr prn asd

## 2023-11-29 NOTE — Patient Instructions (Signed)
Please take all new medication as prescribed  Please continue all other medications as before, and refills have been done if requested.  Please have the pharmacy call with any other refills you may need.  Please keep your appointments with your specialists as you may have planned     

## 2023-11-29 NOTE — Progress Notes (Signed)
 Patient ID: William Acosta, male   DOB: 1955-08-23, 68 y.o.   MRN: [id number]        Chief Complaint: follow up spider bite to left temple       HPI:  William Acosta is a 68 y.o. male here after walking in the dark in the trees and happened through a spider wab about the head and apparnetly suffered a bite to the left temple, now with swelling, pain worsening without fever, chills.  Pt denies chest pain, increased sob or doe, wheezing, orthopnea, PND, increased LE swelling, palpitations, dizziness or syncope.   Pt denies polydipsia, polyuria, or new focal neuro s/s.    Pt denies recent wt loss, night sweats, loss of appetite, or other constitutional symptoms         Wt Readings from Last 3 Encounters:  11/29/23 206 lb (93.4 kg)  08/26/23 205 lb 3.2 oz (93.1 kg)  07/11/23 210 lb (95.3 kg)   BP Readings from Last 3 Encounters:  11/29/23 136/82  08/26/23 120/70  07/11/23 (!) 133/90         Past Medical History:  Diagnosis Date   Allergic rhinitis    Allergy    Arthritis    Cancer (HCC)    skin melanoma on right leg 2010   Depression    Diverticulosis    GERD (gastroesophageal reflux disease)    Hyperlipidemia    Hypertension    Multinodular goiter    Past Surgical History:  Procedure Laterality Date   SALIVARY GLAND SURGERY     Right    reports that he quit smoking about 38 years ago. His smoking use included cigarettes. He has never used smokeless tobacco. He reports current alcohol use. He reports that he does not use drugs. family history includes Breast cancer in his maternal grandmother; Diabetes in an other family member; Heart attack in his father; Heart disease in his father and another family member; Hypertension in his mother; Pancreatic cancer in his maternal grandmother. Allergies  Allergen Reactions   Moxifloxacin     REACTION: felt bad   Current Outpatient Medications on File Prior to Visit  Medication Sig Dispense Refill   ALPRAZolam  (XANAX ) 0.25 MG tablet  Take 1 tablet (0.25 mg total) by mouth 2 (two) times daily as needed for anxiety. 60 tablet 2   chlorhexidine  (PERIDEX ) 0.12 % solution Use as directed 15 mLs in the mouth or throat 2 (two) times daily. 120 mL 0   Cholecalciferol (VITAMIN D3) 50 MCG (2000 UT) capsule Take 1 capsule (2,000 Units total) by mouth daily. 100 capsule 3   meloxicam  (MOBIC ) 15 MG tablet TAKE 1 TABLET (15 MG TOTAL) BY MOUTH DAILY. 30 tablet 1   telmisartan  (MICARDIS ) 80 MG tablet TAKE 1 TABLET (80 MG TOTAL) BY MOUTH DAILY. ANNUAL APPT DUE IN JUNE MUST SEE PROVIDER FOR FUTURE REFILLS 90 tablet 1   Vitamin D , Ergocalciferol , (DRISDOL ) 1.25 MG (50000 UNIT) CAPS capsule Take 1 capsule (50,000 Units total) by mouth every 7 (seven) days. 8 capsule 0   Current Facility-Administered Medications on File Prior to Visit  Medication Dose Route Frequency Provider Last Rate Last Admin   cyanocobalamin  (VITAMIN B12) injection 1,000 mcg  1,000 mcg Intramuscular Q30 days Plotnikov, Aleksei V, MD   1,000 mcg at 03/27/22 1452        ROS:  All others reviewed and negative.  Objective        PE:  BP 136/82 (BP Location: Left Arm, Patient Position:  Sitting, Cuff Size: Normal)   Pulse 79   Temp 98.4 F (36.9 C) (Oral)   Ht 5\' 9"  (1.753 m)   Wt 206 lb (93.4 kg)   SpO2 98%   BMI 30.42 kg/m                 Constitutional: Pt appears in NAD               HENT: Head: NCAT.                Right Ear: External ear normal.                 Left Ear: External ear normal.                Eyes: . Pupils are equal, round, and reactive to light. Conjunctivae and EOM are normal               Nose: without d/c or deformity               Neck: Neck supple. Gross normal ROM               Cardiovascular: Normal rate and regular rhythm.                 Pulmonary/Chest: Effort normal and breath sounds without rales or wheezing.                A               Neurological: Pt is alert. At baseline orientation, motor grossly intact               Skin:  Skin is warm., LE edema - none; left temple with 2 cm area red, tender without definite fluctuance or drainage               Psychiatric: Pt behavior is normal without agitation   Micro: none  Cardiac tracings I have personally interpreted today:  none  Pertinent Radiological findings (summarize): none   Lab Results  Component Value Date   WBC 6.0 10/29/2023   HGB 16.1 10/29/2023   HCT 48.1 10/29/2023   PLT 246.0 10/29/2023   GLUCOSE 91 10/29/2023   CHOL 167 11/13/2021   TRIG 98.0 11/13/2021   HDL 35.00 (L) 11/13/2021   LDLCALC 112 (H) 11/13/2021   ALT 13 10/29/2023   AST 16 10/29/2023   NA 142 10/29/2023   K 4.6 10/29/2023   CL 105 10/29/2023   CREATININE 0.99 10/29/2023   BUN 9 10/29/2023   CO2 30 10/29/2023   TSH 0.92 10/29/2023   PSA 4.25 (H) 11/13/2021   HGBA1C 5.6 06/25/2022   Assessment/Plan:  William Acosta is a 68 y.o. White or Caucasian [1] male with  has a past medical history of Allergic rhinitis, Allergy, Arthritis, Cancer (HCC), Depression, Diverticulosis, GERD (gastroesophageal reflux disease), Hyperlipidemia, Hypertension, and Multinodular goiter.  Spider bite wound With possible early infection, for doxycycline  100 bid course, and triam cr prn asd  Vitamin D  deficiency Last vitamin D  Lab Results  Component Value Date   VD25OH 16.19 (L) 10/29/2023   Low, pt states will now start oral replacement as per pcp intended   B12 deficiency Lab Results  Component Value Date   VITAMINB12 219 10/29/2023   Low, to start oral replacement - b12 1000 mcg qd  Followup: Return if symptoms worsen or fail to improve.  Rosalia Colonel, MD 11/29/2023 7:40 PM [facility]

## 2023-11-29 NOTE — Assessment & Plan Note (Signed)
 Last vitamin D  Lab Results  Component Value Date   VD25OH 16.19 (L) 10/29/2023   Low, pt states will now start oral replacement as per pcp intended

## 2023-12-22 ENCOUNTER — Ambulatory Visit: Admitting: Internal Medicine

## 2023-12-29 ENCOUNTER — Ambulatory Visit

## 2023-12-29 DIAGNOSIS — E538 Deficiency of other specified B group vitamins: Secondary | ICD-10-CM

## 2023-12-29 MED ORDER — CYANOCOBALAMIN 1000 MCG/ML IJ SOLN
100.0000 ug | Freq: Once | INTRAMUSCULAR | Status: AC
  Administered 2023-12-29: 100 ug via INTRAMUSCULAR

## 2023-12-29 NOTE — Progress Notes (Cosign Needed Addendum)
 Pt here for monthly B12 injection per Dr.Plotnikov  B12 1000mcg given IM and pt tolerated injection well.  Next B12 injection scheduled for next month   Medical screening examination/treatment/procedure(s) were performed by non-physician practitioner and as supervising physician I was immediately available for consultation/collaboration.  I agree with above. Karlynn Noel, MD

## 2024-01-04 ENCOUNTER — Other Ambulatory Visit: Payer: Self-pay | Admitting: Internal Medicine

## 2024-01-04 MED ORDER — VITAMIN D3 50 MCG (2000 UT) PO CAPS: 2000.0000 [IU] | ORAL_CAPSULE | Freq: Every day | ORAL | 3 refills | Status: AC

## 2024-01-14 ENCOUNTER — Encounter: Payer: Self-pay | Admitting: Advanced Practice Midwife

## 2024-01-31 ENCOUNTER — Ambulatory Visit

## 2024-02-03 ENCOUNTER — Ambulatory Visit: Admitting: Internal Medicine

## 2024-02-08 ENCOUNTER — Ambulatory Visit: Admitting: Internal Medicine

## 2024-02-16 ENCOUNTER — Ambulatory Visit: Admitting: Internal Medicine

## 2024-02-16 ENCOUNTER — Encounter: Payer: Self-pay | Admitting: Internal Medicine

## 2024-02-16 VITALS — BP 136/82 | HR 69 | Temp 98.6°F | Ht 69.0 in | Wt 204.0 lb

## 2024-02-16 DIAGNOSIS — R972 Elevated prostate specific antigen [PSA]: Secondary | ICD-10-CM | POA: Diagnosis not present

## 2024-02-16 DIAGNOSIS — E538 Deficiency of other specified B group vitamins: Secondary | ICD-10-CM

## 2024-02-16 DIAGNOSIS — F413 Other mixed anxiety disorders: Secondary | ICD-10-CM | POA: Diagnosis not present

## 2024-02-16 DIAGNOSIS — R5383 Other fatigue: Secondary | ICD-10-CM

## 2024-02-16 DIAGNOSIS — F5104 Psychophysiologic insomnia: Secondary | ICD-10-CM

## 2024-02-16 LAB — COMPREHENSIVE METABOLIC PANEL WITH GFR
ALT: 14 U/L (ref 0–53)
AST: 17 U/L (ref 0–37)
Albumin: 4.1 g/dL (ref 3.5–5.2)
Alkaline Phosphatase: 118 U/L — ABNORMAL HIGH (ref 39–117)
BUN: 12 mg/dL (ref 6–23)
CO2: 32 meq/L (ref 19–32)
Calcium: 9.1 mg/dL (ref 8.4–10.5)
Chloride: 102 meq/L (ref 96–112)
Creatinine, Ser: 0.94 mg/dL (ref 0.40–1.50)
GFR: 82.29 mL/min (ref >60.00)
Glucose, Bld: 102 mg/dL — ABNORMAL HIGH (ref 70–99)
Potassium: 4.5 meq/L (ref 3.5–5.1)
Sodium: 140 meq/L (ref 135–145)
Total Bilirubin: 0.4 mg/dL (ref 0.2–1.2)
Total Protein: 6.7 g/dL (ref 6.0–8.3)

## 2024-02-16 LAB — PSA: PSA: 7.26 ng/mL — ABNORMAL HIGH (ref 0.10–4.00)

## 2024-02-16 MED ORDER — ZOLPIDEM TARTRATE 10 MG PO TABS: 10.0000 mg | ORAL_TABLET | Freq: Every evening | ORAL | 2 refills | Status: AC | PRN

## 2024-02-16 MED ORDER — CYANOCOBALAMIN 1000 MCG/ML IJ SOLN
1000.0000 ug | Freq: Once | INTRAMUSCULAR | Status: AC
Start: 2024-02-16 — End: 2024-02-16
  Administered 2024-02-16: 1000 ug via INTRAMUSCULAR

## 2024-02-16 MED ORDER — ESCITALOPRAM OXALATE 5 MG PO TABS: 5.0000 mg | ORAL_TABLET | Freq: Every day | ORAL | 3 refills | Status: DC

## 2024-02-16 NOTE — Assessment & Plan Note (Addendum)
 Worse Tri has been under a lot of stress with his mother's dementia (she has dementia and  lives in [place]  w/his sister, her dementia is progressing) Xanax  prn - d/c Treat insomnia: Zolpidem  prn  Potential benefits of a long term benzodiazepines  use as well as potential risks  and complications were explained to the patient and were aknowledged. Lexapro  5 mg/d

## 2024-02-16 NOTE — Patient Instructions (Signed)
There are natural ways to boost your testosterone:  1. Lose Weight If you're overweight, shedding the excess pounds may increase your testosterone levels, according to multiple research. Overweight men are more likely to have low testosterone levels to begin with, so this is an important trick to increase your body's testosterone production when you need it most.   2. Strength Training    Strength training is also known to boost testosterone levels, provided you are doing so intensely enough. When strength training to boost testosterone, you'll want to increase the weight and lower your number of reps, and then focus on exercises that work a large number of muscles.  3. Optimize Your Vitamin D Levels Vitamin D, a steroid hormone, is essential for the healthy development of the nucleus of the sperm cell, and helps maintain semen quality and sperm count. Vitamin D also increases levels of testosterone, which may boost libido. In one study, overweight men who were given vitamin D supplements had a significant increase in testosterone levels after one year.  4. Reduce Stress When you're under a lot of stress, your body releases high levels of the stress hormone cortisol. This hormone actually blocks the effects of testosterone, presumably because, from a biological standpoint, testosterone-associated behaviors (mating, competing, aggression) may have lowered your chances of survival in an emergency (hence, the "fight or flight" response is dominant, courtesy of cortisol).  5. Limit or Eliminate Sugar from Your Diet Testosterone levels decrease after you eat sugar, which is likely because the sugar leads to a high insulin level, another factor leading to low testosterone.  6. Eat Healthy Fats By healthy, this means not only mon- and polyunsaturated fats, like that found in avocadoes and nuts, but also saturated, as these are essential for building testosterone. Research shows that a diet with less than  40 percent of energy as fat (and that mainly from animal sources, i.e. saturated) lead to a decrease in testosterone levels.  It's important to understand that your body requires saturated fats from animal and vegetable sources (such as meat, dairy, certain oils, and tropical plants like coconut) for optimal functioning, and if you neglect this important food group in favor of sugar, grains and other starchy carbs, your health and weight are almost guaranteed to suffer. Examples of healthy fats you can eat more of to give your testosterone levels a boost include:  Olives and Olive oil  Coconuts and coconut oil Butter made from organic milk  Raw nuts, such as, almonds or pecans Eggs Avocados   Meats Palm oil Unheated organic nut oils   7. "Testosterone boosters" containing Vitamin D-3, Niacin, Vitamin B-6, Vitamin B-12, Magnesium, Zinc, Selenium, D-Aspartic Acid, Fenugreed Seed Extract, Oystershell, Suma Extract, Siberian Ginseng may be helpful as well.   

## 2024-02-16 NOTE — Progress Notes (Unsigned)
 Subjective:  Patient ID: William Acosta, male    DOB: 11/02/1955  Age: 68 y.o. MRN: [id number]  CC: Medical Management of Chronic Issues   HPI William Acosta presents for insomnia - waking up at 2 am - up till 5 am [name] fell in Dec 2025 in his bathroom - no LOC He put a dent in the wall Not taking alprazolam  Poor focus  Outpatient Medications Prior to Visit  Medication Sig Dispense Refill   chlorhexidine  (PERIDEX ) 0.12 % solution Use as directed 15 mLs in the mouth or throat 2 (two) times daily. 120 mL 0   Cholecalciferol (VITAMIN D3) 50 MCG (2000 UT) capsule Take 1 capsule (2,000 Units total) by mouth daily. 100 capsule 3   meloxicam  (MOBIC ) 15 MG tablet TAKE 1 TABLET (15 MG TOTAL) BY MOUTH DAILY. 30 tablet 1   telmisartan  (MICARDIS ) 80 MG tablet TAKE 1 TABLET (80 MG TOTAL) BY MOUTH DAILY. ANNUAL APPT DUE IN JUNE MUST SEE PROVIDER FOR FUTURE REFILLS 90 tablet 1   terbinafine (LAMISIL) 250 MG tablet Take 250 mg by mouth daily.     triamcinolone  cream (KENALOG ) 0.1 % Apply 1 Application topically 2 (two) times daily. 30 g 1   Vitamin D , Ergocalciferol , (DRISDOL ) 1.25 MG (50000 UNIT) CAPS capsule Take 1 capsule (50,000 Units total) by mouth every 7 (seven) days. 8 capsule 0   ALPRAZolam  (XANAX ) 0.25 MG tablet Take 1 tablet (0.25 mg total) by mouth 2 (two) times daily as needed for anxiety. 60 tablet 2   doxycycline  (VIBRA -TABS) 100 MG tablet Take 1 tablet (100 mg total) by mouth 2 (two) times daily. 20 tablet 0   Facility-Administered Medications Prior to Visit  Medication Dose Route Frequency Provider Last Rate Last Admin   cyanocobalamin  (VITAMIN B12) injection 1,000 mcg  1,000 mcg Intramuscular Q30 days Chavela Justiniano V, MD   1,000 mcg at 03/27/22 1452    ROS: Review of Systems  Constitutional:  Positive for unexpected weight change. Negative for appetite change and fatigue.  HENT:  Negative for congestion, nosebleeds, sneezing, sore throat and trouble swallowing.   Eyes:   Negative for itching and visual disturbance.  Respiratory:  Negative for cough.   Cardiovascular:  Negative for chest pain, palpitations and leg swelling.  Gastrointestinal:  Negative for abdominal distention, blood in stool, diarrhea and nausea.  Genitourinary:  Negative for frequency and hematuria.  Musculoskeletal:  Negative for back pain, gait problem, joint swelling and neck pain.  Skin:  Negative for rash.  Neurological:  Negative for dizziness, tremors, speech difficulty and weakness.  Psychiatric/Behavioral:  Positive for dysphoric mood and sleep disturbance. Negative for agitation and suicidal ideas. The patient is nervous/anxious.     Objective:  BP 136/82   Pulse 69   Temp 98.6 F (37 C) (Oral)   Ht 5' 9 (1.753 m)   Wt 204 lb (92.5 kg)   SpO2 97%   BMI 30.13 kg/m   BP Readings from Last 3 Encounters:  02/16/24 136/82  11/29/23 136/82  08/26/23 120/70    Wt Readings from Last 3 Encounters:  02/16/24 204 lb (92.5 kg)  11/29/23 206 lb (93.4 kg)  08/26/23 205 lb 3.2 oz (93.1 kg)    Physical Exam Constitutional:      General: He is not in acute distress.    Appearance: He is well-developed. He is obese.     Comments: NAD  Eyes:     Conjunctiva/sclera: Conjunctivae normal.     Pupils: Pupils are  equal, round, and reactive to light.  Neck:     Thyroid : No thyromegaly.     Vascular: No JVD.  Cardiovascular:     Rate and Rhythm: Normal rate and regular rhythm.     Heart sounds: Normal heart sounds. No murmur heard.    No friction rub. No gallop.  Pulmonary:     Effort: Pulmonary effort is normal. No respiratory distress.     Breath sounds: Normal breath sounds. No wheezing or rales.  Chest:     Chest wall: No tenderness.  Abdominal:     General: Bowel sounds are normal. There is no distension.     Palpations: Abdomen is soft. There is no mass.     Tenderness: There is no abdominal tenderness. There is no guarding or rebound.  Musculoskeletal:         General: No tenderness. Normal range of motion.     Cervical back: Normal range of motion.  Lymphadenopathy:     Cervical: No cervical adenopathy.  Skin:    General: Skin is warm and dry.     Findings: No rash.  Neurological:     Mental Status: He is alert and oriented to person, place, and time.     Cranial Nerves: No cranial nerve deficit.     Motor: No abnormal muscle tone.     Coordination: Coordination normal.     Gait: Gait normal.     Deep Tendon Reflexes: Reflexes are normal and symmetric.  Psychiatric:        Behavior: Behavior normal.        Thought Content: Thought content normal.        Judgment: Judgment normal.     Lab Results  Component Value Date   WBC 6.0 10/29/2023   HGB 16.1 10/29/2023   HCT 48.1 10/29/2023   PLT 246.0 10/29/2023   GLUCOSE 102 (H) 02/16/2024   CHOL 167 11/13/2021   TRIG 98.0 11/13/2021   HDL 35.00 (L) 11/13/2021   LDLCALC 112 (H) 11/13/2021   ALT 14 02/16/2024   AST 17 02/16/2024   NA 140 02/16/2024   K 4.5 02/16/2024   CL 102 02/16/2024   CREATININE 0.94 02/16/2024   BUN 12 02/16/2024   CO2 32 02/16/2024   TSH 0.92 10/29/2023   PSA 7.26 (H) 02/16/2024   HGBA1C 5.6 06/25/2022    No results found.  Assessment & Plan:   Problem List Items Addressed This Visit     Anxiety disorder   Worse Sylar has been under a lot of stress with his mother's dementia (she has dementia and  lives in [place]  w/his sister, her dementia is progressing) Xanax  prn - d/c Treat insomnia: Zolpidem  prn  Potential benefits of a long term benzodiazepines  use as well as potential risks  and complications were explained to the patient and were aknowledged. Lexapro  5 mg/d      Relevant Medications   escitalopram  (LEXAPRO ) 5 MG tablet   B12 deficiency - Primary   On Vit B12      Elevated PSA   Check PSA      Relevant Orders   Comprehensive metabolic panel with GFR (Completed)   PSA (Completed)   Fatigue   Treat insomnia       Insomnia   William Acosta has been under a lot of stress with his mother's dementia (she has dementia and  lives in [place]  w/his sister, her dementia is progressing) R/o OSA Zolpidem  prn  Potential benefits of a long term  benzodiazepines  use as well as potential risks  and complications were explained to the patient and were aknowledged. Lexapro  5 mg/d         Meds ordered this encounter  Medications   cyanocobalamin  (VITAMIN B12) injection 1,000 mcg   escitalopram  (LEXAPRO ) 5 MG tablet    Sig: Take 1 tablet (5 mg total) by mouth daily.    Dispense:  30 tablet    Refill:  3   zolpidem  (AMBIEN ) 10 MG tablet    Sig: Take 1 tablet (10 mg total) by mouth at bedtime as needed for sleep.    Dispense:  30 tablet    Refill:  2      Follow-up: Return in about 2 months (around 04/17/2024) for a follow-up visit.  Marolyn Noel, MD

## 2024-02-16 NOTE — Assessment & Plan Note (Signed)
 On Vit B12

## 2024-02-17 DIAGNOSIS — G4700 Insomnia, unspecified: Secondary | ICD-10-CM | POA: Insufficient documentation

## 2024-02-17 NOTE — Assessment & Plan Note (Signed)
 Check PSA. ?

## 2024-02-17 NOTE — Assessment & Plan Note (Signed)
Treat insomnia 

## 2024-02-17 NOTE — Assessment & Plan Note (Signed)
 William Acosta has been under a lot of stress with his mother's dementia (she has dementia and  lives in [place]  w/his sister, her dementia is progressing) R/o OSA Zolpidem  prn  Potential benefits of a long term benzodiazepines  use as well as potential risks  and complications were explained to the patient and were aknowledged. Lexapro  5 mg/d

## 2024-03-09 ENCOUNTER — Other Ambulatory Visit: Payer: Self-pay | Admitting: Internal Medicine

## 2024-03-22 ENCOUNTER — Ambulatory Visit

## 2024-03-30 ENCOUNTER — Ambulatory Visit (INDEPENDENT_AMBULATORY_CARE_PROVIDER_SITE_OTHER)

## 2024-03-30 DIAGNOSIS — E538 Deficiency of other specified B group vitamins: Secondary | ICD-10-CM

## 2024-03-30 MED ORDER — CYANOCOBALAMIN 1000 MCG/ML IJ SOLN
1000.0000 ug | Freq: Once | INTRAMUSCULAR | Status: AC
  Administered 2024-03-30: 1000 ug via INTRAMUSCULAR

## 2024-03-30 NOTE — Progress Notes (Cosign Needed Addendum)
 Patient here for monthly B12 injection per Dr. Garald. B12 1000 mcg given in right  IM and patient tolerated injection well today.   Medical screening examination/treatment/procedure(s) were performed by non-physician practitioner and as supervising physician I was immediately available for consultation/collaboration.  I agree with above. Karlynn Garald, MD

## 2024-04-18 ENCOUNTER — Ambulatory Visit (INDEPENDENT_AMBULATORY_CARE_PROVIDER_SITE_OTHER): Admitting: Internal Medicine

## 2024-04-18 ENCOUNTER — Telehealth: Payer: Self-pay

## 2024-04-18 ENCOUNTER — Encounter: Payer: Self-pay | Admitting: Internal Medicine

## 2024-04-18 VITALS — BP 100/62 | HR 88 | Temp 98.6°F | Ht 69.0 in | Wt 212.6 lb

## 2024-04-18 DIAGNOSIS — E559 Vitamin D deficiency, unspecified: Secondary | ICD-10-CM

## 2024-04-18 DIAGNOSIS — R972 Elevated prostate specific antigen [PSA]: Secondary | ICD-10-CM | POA: Diagnosis not present

## 2024-04-18 DIAGNOSIS — E538 Deficiency of other specified B group vitamins: Secondary | ICD-10-CM | POA: Diagnosis not present

## 2024-04-18 DIAGNOSIS — E785 Hyperlipidemia, unspecified: Secondary | ICD-10-CM

## 2024-04-18 MED ORDER — VITAMIN D (ERGOCALCIFEROL) 1.25 MG (50000 UNIT) PO CAPS: 50000.0000 [IU] | ORAL_CAPSULE | ORAL | 0 refills | Status: DC

## 2024-04-18 MED ORDER — CIPROFLOXACIN HCL 500 MG PO TABS: 500.0000 mg | ORAL_TABLET | Freq: Two times a day (BID) | ORAL | 0 refills | Status: DC

## 2024-04-18 MED ORDER — TELMISARTAN 80 MG PO TABS: 80.0000 mg | ORAL_TABLET | Freq: Every day | ORAL | 2 refills | Status: AC

## 2024-04-18 NOTE — Assessment & Plan Note (Addendum)
 Worse: Urology referral Cipro x 2 wks RTC for another PSA in 4 weeks

## 2024-04-18 NOTE — Assessment & Plan Note (Signed)
Continue with vitamin B12 therapy

## 2024-04-18 NOTE — Assessment & Plan Note (Signed)
Vit D Rx weekly

## 2024-04-18 NOTE — Telephone Encounter (Signed)
 Copied from CRM [id number]. Topic: Referral - Status >> Apr 18, 2024  3:56 PM Pinkey ORN wrote: Reason for CRM: [facility] >> Apr 18, 2024  3:59 PM Pinkey ORN wrote: [facility] [phone] ext [phone]  Called on behalf of patient, states they received the CT Cardiac Scoring. States it's currently set as self pay only but would need to be changed over to [facility] only in order to schedule.

## 2024-04-18 NOTE — Progress Notes (Signed)
 Subjective:  Patient ID: William Acosta, male    DOB: 14-Nov-1955  Age: 68 y.o. MRN: [id number]  CC: Medical Management of Chronic Issues (2 Month Follow up)   HPI William Acosta presents for HTN, elevated PSA, anxiety  Outpatient Medications Prior to Visit  Medication Sig Dispense Refill   chlorhexidine  (PERIDEX ) 0.12 % solution Use as directed 15 mLs in the mouth or throat 2 (two) times daily. 120 mL 0   Cholecalciferol (VITAMIN D3) 50 MCG (2000 UT) capsule Take 1 capsule (2,000 Units total) by mouth daily. 100 capsule 3   escitalopram  (LEXAPRO ) 5 MG tablet TAKE 1 TABLET (5 MG TOTAL) BY MOUTH DAILY. 90 tablet 0   meloxicam  (MOBIC ) 15 MG tablet TAKE 1 TABLET (15 MG TOTAL) BY MOUTH DAILY. 30 tablet 1   terbinafine (LAMISIL) 250 MG tablet Take 250 mg by mouth daily.     triamcinolone  cream (KENALOG ) 0.1 % Apply 1 Application topically 2 (two) times daily. 30 g 1   zolpidem  (AMBIEN ) 10 MG tablet Take 1 tablet (10 mg total) by mouth at bedtime as needed for sleep. 30 tablet 2   telmisartan  (MICARDIS ) 80 MG tablet TAKE 1 TABLET (80 MG TOTAL) BY MOUTH DAILY. ANNUAL APPT DUE IN JUNE MUST SEE PROVIDER FOR FUTURE REFILLS 90 tablet 1   Vitamin D , Ergocalciferol , (DRISDOL ) 1.25 MG (50000 UNIT) CAPS capsule Take 1 capsule (50,000 Units total) by mouth every 7 (seven) days. 8 capsule 0   Facility-Administered Medications Prior to Visit  Medication Dose Route Frequency Provider Last Rate Last Admin   cyanocobalamin  (VITAMIN B12) injection 1,000 mcg  1,000 mcg Intramuscular Q30 days Mattisen Pohlmann V, MD   1,000 mcg at 03/27/22 1452    ROS: Review of Systems  Constitutional:  Positive for unexpected weight change. Negative for appetite change and fatigue.  HENT:  Negative for congestion, nosebleeds, sneezing, sore throat and trouble swallowing.   Eyes:  Negative for itching and visual disturbance.  Respiratory:  Negative for cough.   Cardiovascular:  Negative for chest pain, palpitations and  leg swelling.  Gastrointestinal:  Negative for abdominal distention, blood in stool, diarrhea and nausea.  Genitourinary:  Negative for frequency and hematuria.  Musculoskeletal:  Negative for back pain, gait problem, joint swelling and neck pain.  Skin:  Negative for rash.  Neurological:  Negative for dizziness, tremors, speech difficulty and weakness.  Psychiatric/Behavioral:  Negative for agitation, dysphoric mood and sleep disturbance. The patient is not nervous/anxious.     Objective:  BP 100/62   Pulse 88   Temp 98.6 F (37 C)   Ht 5' 9 (1.753 m)   Wt 212 lb 9.6 oz (96.4 kg)   SpO2 96%   BMI 31.40 kg/m   BP Readings from Last 3 Encounters:  04/18/24 100/62  02/16/24 136/82  11/29/23 136/82    Wt Readings from Last 3 Encounters:  04/18/24 212 lb 9.6 oz (96.4 kg)  02/16/24 204 lb (92.5 kg)  11/29/23 206 lb (93.4 kg)    Physical Exam Constitutional:      General: He is not in acute distress.    Appearance: He is well-developed. He is obese.     Comments: NAD  Eyes:     Conjunctiva/sclera: Conjunctivae normal.     Pupils: Pupils are equal, round, and reactive to light.  Neck:     Thyroid : No thyromegaly.     Vascular: No JVD.  Cardiovascular:     Rate and Rhythm: Normal rate and regular  rhythm.     Heart sounds: Normal heart sounds. No murmur heard.    No friction rub. No gallop.  Pulmonary:     Effort: Pulmonary effort is normal. No respiratory distress.     Breath sounds: Normal breath sounds. No wheezing or rales.  Chest:     Chest wall: No tenderness.  Abdominal:     General: Bowel sounds are normal. There is no distension.     Palpations: Abdomen is soft. There is no mass.     Tenderness: There is no abdominal tenderness. There is no guarding or rebound.  Musculoskeletal:        General: No tenderness. Normal range of motion.     Cervical back: Normal range of motion.  Lymphadenopathy:     Cervical: No cervical adenopathy.  Skin:    General: Skin  is warm and dry.     Findings: No rash.  Neurological:     Mental Status: He is alert and oriented to person, place, and time.     Cranial Nerves: No cranial nerve deficit.     Motor: No abnormal muscle tone.     Coordination: Coordination normal.     Gait: Gait normal.     Deep Tendon Reflexes: Reflexes are normal and symmetric.  Psychiatric:        Behavior: Behavior normal.        Thought Content: Thought content normal.        Judgment: Judgment normal.     Lab Results  Component Value Date   WBC 6.0 10/29/2023   HGB 16.1 10/29/2023   HCT 48.1 10/29/2023   PLT 246.0 10/29/2023   GLUCOSE 102 (H) 02/16/2024   CHOL 167 11/13/2021   TRIG 98.0 11/13/2021   HDL 35.00 (L) 11/13/2021   LDLCALC 112 (H) 11/13/2021   ALT 14 02/16/2024   AST 17 02/16/2024   NA 140 02/16/2024   K 4.5 02/16/2024   CL 102 02/16/2024   CREATININE 0.94 02/16/2024   BUN 12 02/16/2024   CO2 32 02/16/2024   TSH 0.92 10/29/2023   PSA 7.26 (H) 02/16/2024   HGBA1C 5.6 06/25/2022    No results found.  Assessment & Plan:   Problem List Items Addressed This Visit     B12 deficiency   Continue with vitamin B12 therapy      Elevated PSA - Primary   Worse: Urology referral Cipro x 2 wks RTC for another PSA in 4 weeks      Relevant Orders   Ambulatory referral to Urology   Vitamin D  deficiency   Vit D Rx weekly      Other Visit Diagnoses       Dyslipidemia       Relevant Orders   CT CARDIAC SCORING ([facility] LOCATIONS ONLY)         Meds ordered this encounter  Medications   telmisartan  (MICARDIS ) 80 MG tablet    Sig: Take 1 tablet (80 mg total) by mouth daily. Annual appt due in June must see provider for future refills    Dispense:  90 tablet    Refill:  2   Vitamin D , Ergocalciferol , (DRISDOL ) 1.25 MG (50000 UNIT) CAPS capsule    Sig: Take 1 capsule (50,000 Units total) by mouth every 7 (seven) days.    Dispense:  8 capsule    Refill:  0   ciprofloxacin (CIPRO) 500 MG tablet     Sig: Take 1 tablet (500 mg total) by mouth 2 (two) times  daily for 14 days.    Dispense:  28 tablet    Refill:  0      Follow-up: Return in about 3 months (around 07/19/2024) for a follow-up visit.  Marolyn Noel, MD

## 2024-04-24 ENCOUNTER — Other Ambulatory Visit

## 2024-04-26 ENCOUNTER — Ambulatory Visit
Admission: RE | Admit: 2024-04-26 | Discharge: 2024-04-26 | Disposition: A | Discharge status: Home / Self Care | Source: Ambulatory Visit | Attending: Internal Medicine | Admitting: Internal Medicine

## 2024-04-26 DIAGNOSIS — E785 Hyperlipidemia, unspecified: Secondary | ICD-10-CM

## 2024-04-29 ENCOUNTER — Ambulatory Visit: Payer: Self-pay | Admitting: Internal Medicine

## 2024-04-29 DIAGNOSIS — I2583 Coronary atherosclerosis due to lipid rich plaque: Secondary | ICD-10-CM

## 2024-04-29 DIAGNOSIS — I2510 Atherosclerotic heart disease of native coronary artery without angina pectoris: Secondary | ICD-10-CM | POA: Insufficient documentation

## 2024-04-29 DIAGNOSIS — I700 Atherosclerosis of aorta: Secondary | ICD-10-CM | POA: Insufficient documentation

## 2024-04-29 MED ORDER — ROSUVASTATIN CALCIUM 5 MG PO TABS: 5.0000 mg | ORAL_TABLET | Freq: Every day | ORAL | 3 refills | Status: AC

## 2024-04-29 MED ORDER — ASPIRIN 81 MG PO TBEC: 81.0000 mg | DELAYED_RELEASE_TABLET | Freq: Every day | ORAL | Status: AC

## 2024-05-04 ENCOUNTER — Ambulatory Visit (INDEPENDENT_AMBULATORY_CARE_PROVIDER_SITE_OTHER): Payer: Self-pay

## 2024-05-04 DIAGNOSIS — E538 Deficiency of other specified B group vitamins: Secondary | ICD-10-CM

## 2024-05-04 MED ORDER — CYANOCOBALAMIN 1000 MCG/ML IJ SOLN
1000.0000 ug | Freq: Once | INTRAMUSCULAR | Status: AC
  Administered 2024-05-04: 1000 ug via INTRAMUSCULAR

## 2024-05-04 NOTE — Progress Notes (Cosign Needed Addendum)
 After obtaining consent, and per orders of Dr. Garald, injection of B12 given by Edsel CHRISTELLA Kerns. Patient tolerated well.  Medical screening examination/treatment/procedure(s) were performed by non-physician practitioner and as supervising physician I was immediately available for consultation/collaboration.  I agree with above. Karlynn Garald, MD

## 2024-06-05 ENCOUNTER — Ambulatory Visit: Payer: Self-pay

## 2024-06-08 ENCOUNTER — Other Ambulatory Visit: Payer: Self-pay | Admitting: Internal Medicine

## 2024-06-09 ENCOUNTER — Ambulatory Visit

## 2024-06-09 DIAGNOSIS — E538 Deficiency of other specified B group vitamins: Secondary | ICD-10-CM | POA: Diagnosis not present

## 2024-06-09 MED ORDER — CYANOCOBALAMIN 1000 MCG/ML IJ SOLN
1000.0000 ug | Freq: Once | INTRAMUSCULAR | Status: AC
  Administered 2024-06-09: 1000 ug via INTRAMUSCULAR

## 2024-06-09 NOTE — Progress Notes (Signed)
 Pt was given B12 injec w/o any complications at this time.

## 2024-06-28 ENCOUNTER — Encounter: Payer: Self-pay | Admitting: Internal Medicine

## 2024-06-28 ENCOUNTER — Ambulatory Visit: Admitting: Internal Medicine

## 2024-06-28 VITALS — BP 130/78 | HR 53 | Temp 98.3°F | Ht 69.0 in | Wt 215.0 lb

## 2024-06-28 DIAGNOSIS — I10 Essential (primary) hypertension: Secondary | ICD-10-CM

## 2024-06-28 DIAGNOSIS — R051 Acute cough: Secondary | ICD-10-CM | POA: Diagnosis not present

## 2024-06-28 DIAGNOSIS — R972 Elevated prostate specific antigen [PSA]: Secondary | ICD-10-CM

## 2024-06-28 DIAGNOSIS — R059 Cough, unspecified: Secondary | ICD-10-CM | POA: Insufficient documentation

## 2024-06-28 DIAGNOSIS — R062 Wheezing: Secondary | ICD-10-CM

## 2024-06-28 MED ORDER — AZITHROMYCIN 250 MG PO TABS: ORAL_TABLET | ORAL | 1 refills | Status: AC

## 2024-06-28 MED ORDER — PREDNISONE 10 MG PO TABS: ORAL_TABLET | ORAL | 0 refills | Status: AC

## 2024-06-28 MED ORDER — HYDROCODONE BIT-HOMATROP MBR 5-1.5 MG/5ML PO SOLN: 5.0000 mL | Freq: Four times a day (QID) | ORAL | 0 refills | Status: AC | PRN

## 2024-06-28 NOTE — Progress Notes (Signed)
 Patient ID: William Acosta, male   DOB: [birth year], 68 y.o.   MRN: [id number]        Chief Complaint: follow up cough, wheezing, elevated PSA, htn       HPI:  William Acosta is a 68 y.o. male Here with acute onset mild to mod 2-3 days ST, HA, general weakness and malaise, with prod cough greenish sputum and mild wheezing, sob, but Pt denies chest pain, orthopnea, PND, increased LE swelling, palpitations, dizziness or syncope.   Pt denies polydipsia, polyuria, or new focal neuro s/s.    Pt denies recent wt loss, night sweats, loss of appetite, or other constitutional symptoms         Wt Readings from Last 3 Encounters:  06/28/24 215 lb (97.5 kg)  04/18/24 212 lb 9.6 oz (96.4 kg)  02/16/24 204 lb (92.5 kg)   BP Readings from Last 3 Encounters:  06/28/24 130/78  04/18/24 100/62  02/16/24 136/82         Past Medical History:  Diagnosis Date   Allergic rhinitis    Allergy    Arthritis    Cancer (HCC)    skin melanoma on right leg 2010   Depression    Diverticulosis    GERD (gastroesophageal reflux disease)    Hyperlipidemia    Hypertension    Multinodular goiter    Past Surgical History:  Procedure Laterality Date   SALIVARY GLAND SURGERY     Right    reports that he quit smoking about 39 years ago. His smoking use included cigarettes. He has never used smokeless tobacco. He reports current alcohol use. He reports that he does not use drugs. family history includes Breast cancer in his maternal grandmother; Diabetes in an other family member; Heart attack in his father; Heart disease in his father and another family member; Hypertension in his mother; Pancreatic cancer in his maternal grandmother. Allergies[1] Medications Ordered Prior to Encounter[2]      ROS:  All others reviewed and negative.  Objective        PE:  BP 130/78 (BP Location: Right Arm, Patient Position: Sitting, Cuff Size: Normal)   Pulse (!) 53   Temp 98.3 F (36.8 C) (Oral)   Ht 5' 9 (1.753 m)   Wt  215 lb (97.5 kg)   SpO2 98%   BMI 31.75 kg/m                 Constitutional: Pt appears mild ill               HENT: Head: NCAT.                Right Ear: External ear normal.                 Left Ear: External ear normal. Bilat tm's with mild erythema.  Max sinus areas mild tender.  Pharynx with mild erythema, no exudate               Eyes: . Pupils are equal, round, and reactive to light. Conjunctivae and EOM are normal               Nose: without d/c or deformity               Neck: Neck supple. Gross normal ROM               Cardiovascular: Normal rate and regular rhythm.  Pulmonary/Chest: Effort normal and breath sounds decreased without rales but with few wheezing.                              Neurological: Pt is alert. At baseline orientation, motor grossly intact               Skin: Skin is warm. No rashes, no other new lesions, LE edema - none               Psychiatric: Pt behavior is normal without agitation   Micro: none  Cardiac tracings I have personally interpreted today:  none  Pertinent Radiological findings (summarize): none   Lab Results  Component Value Date   WBC 6.0 10/29/2023   HGB 16.1 10/29/2023   HCT 48.1 10/29/2023   PLT 246.0 10/29/2023   GLUCOSE 102 (H) 02/16/2024   CHOL 167 11/13/2021   TRIG 98.0 11/13/2021   HDL 35.00 (L) 11/13/2021   LDLCALC 112 (H) 11/13/2021   ALT 14 02/16/2024   AST 17 02/16/2024   NA 140 02/16/2024   K 4.5 02/16/2024   CL 102 02/16/2024   CREATININE 0.94 02/16/2024   BUN 12 02/16/2024   CO2 32 02/16/2024   TSH 0.92 10/29/2023   PSA 7.26 (H) 02/16/2024   HGBA1C 5.6 06/25/2022   Assessment/Plan:  William Acosta is a 68 y.o. White or Caucasian [1] male with  has a past medical history of Allergic rhinitis, Allergy, Arthritis, Cancer (HCC), Depression, Diverticulosis, GERD (gastroesophageal reflux disease), Hyperlipidemia, Hypertension, and Multinodular goiter.  Wheezing Mild, declines need for inhaler  or cxr, ok for prednisone taper, to f/u any worsening symptoms or concerns   Hypertension BP Readings from Last 3 Encounters:  06/28/24 130/78  04/18/24 100/62  02/16/24 136/82   Stable, pt to continue medical treatment micardis  80 mg every day   Elevated PSA Pt recently s/p cipro  course for prostatitis now symptoms resolved, but pt requests f/u PSA to assess for any further elevation and need for urology referral  Cough Mild to mod, for antibx course, cough med prn, to f/u any worsening symptoms or concerns  Followup: Return if symptoms worsen or fail to improve.  Lynwood Rush, MD 06/28/2024 9:04 AM [facility]    [1]  Allergies Allergen Reactions   Moxifloxacin     REACTION: felt bad  [2]  Current Outpatient Medications on File Prior to Visit  Medication Sig Dispense Refill   aspirin  EC 81 MG tablet Take 1 tablet (81 mg total) by mouth daily.     chlorhexidine  (PERIDEX ) 0.12 % solution Use as directed 15 mLs in the mouth or throat 2 (two) times daily. 120 mL 0   Cholecalciferol (VITAMIN D3) 50 MCG (2000 UT) capsule Take 1 capsule (2,000 Units total) by mouth daily. 100 capsule 3   escitalopram  (LEXAPRO ) 5 MG tablet TAKE 1 TABLET (5 MG TOTAL) BY MOUTH DAILY. 90 tablet 0   meloxicam  (MOBIC ) 15 MG tablet TAKE 1 TABLET (15 MG TOTAL) BY MOUTH DAILY. 30 tablet 1   nystatin cream (MYCOSTATIN) Apply topically daily.     rosuvastatin  (CRESTOR ) 5 MG tablet Take 1 tablet (5 mg total) by mouth daily. 90 tablet 3   telmisartan  (MICARDIS ) 80 MG tablet Take 1 tablet (80 mg total) by mouth daily. Annual appt due in June must see provider for future refills 90 tablet 2  terbinafine (LAMISIL) 250 MG tablet Take 250 mg by mouth daily.     triamcinolone  cream (KENALOG ) 0.1 % Apply 1 Application topically 2 (two) times daily. 30 g 1   Vitamin D , Ergocalciferol , (DRISDOL ) 1.25 MG (50000 UNIT) CAPS capsule Take 1 capsule  (50,000 Units total) by mouth every 7 (seven) days. 8 capsule 0   zolpidem  (AMBIEN ) 10 MG tablet Take 1 tablet (10 mg total) by mouth at bedtime as needed for sleep. 30 tablet 2   Current Facility-Administered Medications on File Prior to Visit  Medication Dose Route Frequency Provider Last Rate Last Admin   cyanocobalamin  (VITAMIN B12) injection 1,000 mcg  1,000 mcg Intramuscular Q30 days Plotnikov, Aleksei V, MD   1,000 mcg at 03/27/22 1452

## 2024-06-28 NOTE — Patient Instructions (Signed)
 Please take all new medication as prescribed- the antibiotic, cough medicine, and prednisone  Please continue all other medications as before.  Please have the pharmacy call with any other refills you may need.  Please keep your appointments with your specialists as you may have planned  Please return next for the follow up PSA blood testing; if not better, you may need referral to urology

## 2024-06-28 NOTE — Assessment & Plan Note (Signed)
Mild to mod, for antibx course, cough med prn, to f/u any worsening symptoms or concerns 

## 2024-06-28 NOTE — Assessment & Plan Note (Signed)
 Mild, declines need for inhaler or cxr, ok for prednisone taper, to f/u any worsening symptoms or concerns

## 2024-06-28 NOTE — Assessment & Plan Note (Signed)
 Pt recently s/p cipro  course for prostatitis now symptoms resolved, but pt requests f/u PSA to assess for any further elevation and need for urology referral

## 2024-06-28 NOTE — Assessment & Plan Note (Signed)
 BP Readings from Last 3 Encounters:  06/28/24 130/78  04/18/24 100/62  02/16/24 136/82   Stable, pt to continue medical treatment micardis  80 mg every day

## 2024-07-07 ENCOUNTER — Other Ambulatory Visit

## 2024-07-07 ENCOUNTER — Ambulatory Visit: Payer: Self-pay | Admitting: Internal Medicine

## 2024-07-07 ENCOUNTER — Telehealth: Payer: Self-pay

## 2024-07-07 ENCOUNTER — Other Ambulatory Visit: Payer: Self-pay | Admitting: Internal Medicine

## 2024-07-07 DIAGNOSIS — R972 Elevated prostate specific antigen [PSA]: Secondary | ICD-10-CM

## 2024-07-07 DIAGNOSIS — E538 Deficiency of other specified B group vitamins: Secondary | ICD-10-CM

## 2024-07-07 DIAGNOSIS — I10 Essential (primary) hypertension: Secondary | ICD-10-CM

## 2024-07-07 DIAGNOSIS — E559 Vitamin D deficiency, unspecified: Secondary | ICD-10-CM

## 2024-07-07 DIAGNOSIS — R5383 Other fatigue: Secondary | ICD-10-CM

## 2024-07-07 LAB — PSA: PSA: 7.31 ng/mL — ABNORMAL HIGH (ref 0.10–4.00)

## 2024-07-07 NOTE — Telephone Encounter (Signed)
 Copied from CRM #[id number]. Topic: Clinical - Lab/Test Results >> Jul 07, 2024  8:09 AM Avram MATSU wrote: Reason for CRM: patient stated he suppose to get Vitamin D  and blood panel.

## 2024-07-10 ENCOUNTER — Ambulatory Visit

## 2024-07-10 ENCOUNTER — Telehealth: Payer: Self-pay

## 2024-07-10 NOTE — Telephone Encounter (Signed)
 Copied from CRM [id number]. Topic: Appointments - Scheduling Inquiry for Clinic >> Jul 10, 2024  9:13 AM Zy'onna H wrote: Reason for CRM: Patient originally called in to reschedule his B 12 injection today. It appears due to his work schedule he is requesting a time around 4:30 pm.  Looking at the schedule we don't appear to have any openings after 4 pm. Please Advise

## 2024-07-11 NOTE — Telephone Encounter (Signed)
 We do not have any apptmnts for Nurse visit after 4pm

## 2024-07-17 NOTE — Telephone Encounter (Signed)
Labs were ordered. Thanks

## 2024-07-19 ENCOUNTER — Ambulatory Visit: Admitting: Internal Medicine

## 2024-07-27 ENCOUNTER — Other Ambulatory Visit: Payer: Self-pay | Admitting: Internal Medicine

## 2024-07-27 ENCOUNTER — Ambulatory Visit

## 2024-07-27 DIAGNOSIS — E538 Deficiency of other specified B group vitamins: Secondary | ICD-10-CM

## 2024-07-27 MED ORDER — CYANOCOBALAMIN 1000 MCG/ML IJ SOLN
1000.0000 ug | Freq: Once | INTRAMUSCULAR | Status: AC
  Administered 2024-07-27: 1000 ug via INTRAMUSCULAR

## 2024-07-27 NOTE — Telephone Encounter (Signed)
 Patient returned call and has been made aware  of the nurses scheduling availability and scheduled for 07/27/24 at 4:00 pm for his B12 Injection

## 2024-07-27 NOTE — Telephone Encounter (Signed)
 Oh no sorry, this is not normally a refillable medication   If pt feels he might need this, please contact or see his PCP or first available.   Thanks!

## 2024-07-27 NOTE — Progress Notes (Cosign Needed Addendum)
 Patient visits today for their b-12 injection. Patient informed of what they had received and tolerated the injection well. Patient notified to reach out to the office if needed.  Medical screening examination/treatment/procedure(s) were performed by non-physician practitioner and as supervising physician I was immediately available for consultation/collaboration.  I agree with above. Karlynn Noel, MD

## 2024-07-28 NOTE — Telephone Encounter (Signed)
 Called and informed patient of results and plan of care. Patient expressed understanding

## 2024-08-15 ENCOUNTER — Ambulatory Visit: Admitting: Internal Medicine

## 2024-08-28 ENCOUNTER — Ambulatory Visit

## 2024-09-06 ENCOUNTER — Ambulatory Visit: Admitting: Internal Medicine

## 2024-09-13 ENCOUNTER — Ambulatory Visit: Admitting: Internal Medicine
